# Patient Record
Sex: Male | Born: 1990 | Race: White | Hispanic: No | Marital: Single | Smoking: Former smoker
Health system: Southern US, Community
[De-identification: ages and names within clinical notes are randomized; demographics above are authoritative.]

## PROBLEM LIST (undated history)

## (undated) DIAGNOSIS — F1910 Other psychoactive substance abuse, uncomplicated: Secondary | ICD-10-CM

## (undated) DIAGNOSIS — G4700 Insomnia, unspecified: Secondary | ICD-10-CM

## (undated) DIAGNOSIS — L818 Other specified disorders of pigmentation: Secondary | ICD-10-CM

## (undated) DIAGNOSIS — R635 Abnormal weight gain: Secondary | ICD-10-CM

## (undated) DIAGNOSIS — F329 Major depressive disorder, single episode, unspecified: Secondary | ICD-10-CM

## (undated) DIAGNOSIS — E291 Testicular hypofunction: Secondary | ICD-10-CM

## (undated) DIAGNOSIS — L739 Follicular disorder, unspecified: Secondary | ICD-10-CM

## (undated) HISTORY — DX: Insomnia, unspecified: G47.00

## (undated) HISTORY — DX: Other specified disorders of pigmentation: L81.8

## (undated) HISTORY — DX: Abnormal weight gain: R63.5

## (undated) HISTORY — PX: HERNIA REPAIR: SHX51

## (undated) HISTORY — DX: Major depressive disorder, single episode, unspecified: F32.9

## (undated) HISTORY — DX: Other psychoactive substance abuse, uncomplicated: F19.10

## (undated) HISTORY — DX: Follicular disorder, unspecified: L73.9

## (undated) HISTORY — DX: Testicular hypofunction: E29.1

---

## 2015-04-13 ENCOUNTER — Emergency Department (INDEPENDENT_AMBULATORY_CARE_PROVIDER_SITE_OTHER)
Admission: EM | Admit: 2015-04-13 | Discharge: 2015-04-13 | Disposition: A | Discharge status: Nursing Home (Includes ICF) | Payer: Self-pay | Source: Home / Self Care | Attending: Family Medicine | Admitting: Family Medicine

## 2015-04-13 ENCOUNTER — Encounter (HOSPITAL_COMMUNITY): Payer: Self-pay | Admitting: Emergency Medicine

## 2015-04-13 ENCOUNTER — Encounter (HOSPITAL_COMMUNITY): Payer: Self-pay | Admitting: *Deleted

## 2015-04-13 ENCOUNTER — Emergency Department (HOSPITAL_COMMUNITY)
Admission: EM | Admit: 2015-04-13 | Discharge: 2015-04-13 | Disposition: A | Discharge status: Home / Self Care | Payer: Self-pay | Attending: Emergency Medicine | Admitting: Emergency Medicine

## 2015-04-13 ENCOUNTER — Emergency Department (HOSPITAL_COMMUNITY): Payer: Self-pay

## 2015-04-13 DIAGNOSIS — R519 Headache, unspecified: Secondary | ICD-10-CM

## 2015-04-13 DIAGNOSIS — R509 Fever, unspecified: Secondary | ICD-10-CM

## 2015-04-13 DIAGNOSIS — Z79899 Other long term (current) drug therapy: Secondary | ICD-10-CM | POA: Insufficient documentation

## 2015-04-13 DIAGNOSIS — K625 Hemorrhage of anus and rectum: Secondary | ICD-10-CM | POA: Insufficient documentation

## 2015-04-13 DIAGNOSIS — R51 Headache: Secondary | ICD-10-CM

## 2015-04-13 DIAGNOSIS — Z720 Tobacco use: Secondary | ICD-10-CM | POA: Insufficient documentation

## 2015-04-13 LAB — CBC WITH DIFFERENTIAL/PLATELET
Basophils Absolute: 0 10*3/uL (ref 0.0–0.1)
Basophils Relative: 0 % (ref 0–1)
Eosinophils Absolute: 0 10*3/uL (ref 0.0–0.7)
Eosinophils Relative: 0 % (ref 0–5)
HCT: 43.1 % (ref 39.0–52.0)
Hemoglobin: 14.5 g/dL (ref 13.0–17.0)
LYMPHS ABS: 0.9 10*3/uL (ref 0.7–4.0)
Lymphocytes Relative: 20 % (ref 12–46)
MCH: 30.6 pg (ref 26.0–34.0)
MCHC: 33.6 g/dL (ref 30.0–36.0)
MCV: 90.9 fL (ref 78.0–100.0)
Monocytes Absolute: 0.7 10*3/uL (ref 0.1–1.0)
Monocytes Relative: 15 % — ABNORMAL HIGH (ref 3–12)
NEUTROS ABS: 3.1 10*3/uL (ref 1.7–7.7)
Neutrophils Relative %: 65 % (ref 43–77)
PLATELETS: 249 10*3/uL (ref 150–400)
RBC: 4.74 MIL/uL (ref 4.22–5.81)
RDW: 13.2 % (ref 11.5–15.5)
WBC: 4.8 10*3/uL (ref 4.0–10.5)

## 2015-04-13 LAB — COMPREHENSIVE METABOLIC PANEL
ALT: 22 U/L (ref 17–63)
ANION GAP: 12 (ref 5–15)
AST: 31 U/L (ref 15–41)
Albumin: 3.9 g/dL (ref 3.5–5.0)
Alkaline Phosphatase: 47 U/L (ref 38–126)
BUN: 10 mg/dL (ref 6–20)
CO2: 25 mmol/L (ref 22–32)
CREATININE: 1.31 mg/dL — AB (ref 0.61–1.24)
Calcium: 8.8 mg/dL — ABNORMAL LOW (ref 8.9–10.3)
Chloride: 98 mmol/L — ABNORMAL LOW (ref 101–111)
GFR calc non Af Amer: >60 mL/min (ref >60)
Glucose, Bld: 80 mg/dL (ref 65–99)
Potassium: 4.1 mmol/L (ref 3.5–5.1)
Sodium: 135 mmol/L (ref 135–145)
Total Bilirubin: 0.6 mg/dL (ref 0.3–1.2)
Total Protein: 6.7 g/dL (ref 6.5–8.1)

## 2015-04-13 LAB — URINALYSIS, ROUTINE W REFLEX MICROSCOPIC
Glucose, UA: NEGATIVE mg/dL
HGB URINE DIPSTICK: NEGATIVE
Ketones, ur: 15 mg/dL — AB
Leukocytes, UA: NEGATIVE
Nitrite: NEGATIVE
PH: 7 (ref 5.0–8.0)
PROTEIN: 30 mg/dL — AB
Specific Gravity, Urine: 1.031 — ABNORMAL HIGH (ref 1.005–1.030)
UROBILINOGEN UA: 1 mg/dL (ref 0.0–1.0)

## 2015-04-13 LAB — URINE MICROSCOPIC-ADD ON

## 2015-04-13 LAB — I-STAT CG4 LACTIC ACID, ED: Lactic Acid, Venous: 0.86 mmol/L (ref 0.5–2.0)

## 2015-04-13 MED ORDER — IOHEXOL 300 MG/ML  SOLN
25.0000 mL | Freq: Once | INTRAMUSCULAR | Status: AC | PRN
  Administered 2015-04-13: 25 mL via ORAL

## 2015-04-13 MED ORDER — ACETAMINOPHEN 325 MG PO TABS
650.0000 mg | ORAL_TABLET | Freq: Once | ORAL | Status: AC
  Administered 2015-04-13: 650 mg via ORAL

## 2015-04-13 MED ORDER — CIPROFLOXACIN HCL 500 MG PO TABS
500.0000 mg | ORAL_TABLET | Freq: Once | ORAL | Status: AC
  Administered 2015-04-13: 500 mg via ORAL
  Filled 2015-04-13: qty 1

## 2015-04-13 MED ORDER — IBUPROFEN 800 MG PO TABS
ORAL_TABLET | ORAL | Status: AC
  Filled 2015-04-13: qty 1

## 2015-04-13 MED ORDER — ACETAMINOPHEN 500 MG PO TABS
1000.0000 mg | ORAL_TABLET | Freq: Four times a day (QID) | ORAL | Status: DC | PRN
  Administered 2015-04-13: 1000 mg via ORAL
  Filled 2015-04-13: qty 2

## 2015-04-13 MED ORDER — IOHEXOL 300 MG/ML  SOLN
100.0000 mL | Freq: Once | INTRAMUSCULAR | Status: AC | PRN
  Administered 2015-04-13: 100 mL via INTRAVENOUS

## 2015-04-13 MED ORDER — IBUPROFEN 800 MG PO TABS: 800.0000 mg | ORAL_TABLET | Freq: Once | ORAL | Status: DC

## 2015-04-13 MED ORDER — ACETAMINOPHEN 325 MG PO TABS
ORAL_TABLET | ORAL | Status: AC
  Filled 2015-04-13: qty 2

## 2015-04-13 MED ORDER — CIPROFLOXACIN HCL 500 MG PO TABS: 500.0000 mg | ORAL_TABLET | Freq: Two times a day (BID) | ORAL | Status: DC

## 2015-04-13 NOTE — Discharge Instructions (Signed)
If you develop worsening symptoms, especially neck pain or stiffness, worsening headache, pain, worsening rectal bleeding, please return to the ER immediately for repeat evaluation.  Bloody Stools Bloody stools often mean that there is a problem in the digestive tract. Your caregiver may use the term "melena" to describe black, tarry, and bad smelling stools or "hematochezia" to describe red or maroon-colored stools. Blood seen in the stool can be caused by bleeding anywhere along the intestinal tract.  A black stool usually means that blood is coming from the upper part of the gastrointestinal tract (esophagus, stomach, or small bowel). Passing maroon-colored stools or bright red blood usually means that blood is coming from lower down in the large bowel or the rectum. However, sometimes massive bleeding in the stomach or small intestine can cause bright red bloody stools.  Consuming black licorice, lead, iron pills, medicines containing bismuth subsalicylate, or blueberries can also cause black stools. Your caregiver can test black stools to see if blood is present. It is important that the cause of the bleeding be found. Treatment can then be started, and the problem can be corrected. Rectal bleeding may not be serious, but you should not assume everything is okay until you know the cause.It is very important to follow up with your caregiver or a specialist in gastrointestinal problems. CAUSES  Blood in the stools can come from various underlying causes.Often, the cause is not found during your first visit. Testing is often needed to discover the cause of bleeding in the gastrointestinal tract. Causes range from simple to serious or even life-threatening.Possible causes include:  Hemorrhoids.These are veins that are full of blood (engorged) in the rectum. They cause pain, inflammation, and may bleed.  Anal fissures.These are areas of painful tearing which may bleed. They are often caused by  passing hard stool.  Diverticulosis.These are pouches that form on the colon over time, with age, and may bleed significantly.  Diverticulitis.This is inflammation in areas with diverticulosis. It can cause pain, fever, and bloody stools, although bleeding is rare.  Proctitis and colitis. These are inflamed areas of the rectum or colon. They may cause pain, fever, and bloody stools.  Polyps and cancer. Colon cancer is a leading cause of preventable cancer death.It often starts out as precancerous polyps that can be removed during a colonoscopy, preventing progression into cancer. Sometimes, polyps and cancer may cause rectal bleeding.  Gastritis and ulcers.Bleeding from the upper gastrointestinal tract (near the stomach) may travel through the intestines and produce black, sometimes tarry, often bad smelling stools. In certain cases, if the bleeding is fast enough, the stools may not be black, but red and the condition may be life-threatening. SYMPTOMS  You may have stools that are bright red and bloody, that are normal color with blood on them, or that are dark black and tarry. In some cases, you may only have blood in the toilet bowl. Any of these cases need medical care. You may also have:  Pain at the anus or anywhere in the rectum.  Lightheadedness or feeling faint.  Extreme weakness.  Nausea or vomiting.  Fever. DIAGNOSIS Your caregiver may use the following methods to find the cause of your bleeding:  Taking a medical history. Age is important. Older people tend to develop polyps and cancer more often. If there is anal pain and a hard, large stool associated with bleeding, a tear of the anus may be the cause. If blood drips into the toilet after a bowel movement, bleeding hemorrhoids  may be the problem. The color and frequency of the bleeding are additional considerations. In most cases, the medical history provides clues, but seldom the final answer.  A visual and finger  (digital) exam. Your caregiver will inspect the anal area, looking for tears and hemorrhoids. A finger exam can provide information when there is tenderness or a growth inside. In men, the prostate is also examined.  Endoscopy. Several types of small, long scopes (endoscopes) are used to view the colon.  In the office, your caregiver may use a rigid, or more commonly, a flexible viewing sigmoidoscope. This exam is called flexible sigmoidoscopy. It is performed in 5 to 10 minutes.  A more thorough exam is accomplished with a colonoscope. It allows your caregiver to view the entire 5 to 6 foot long colon. Medicine to help you relax (sedative) is usually given for this exam. Frequently, a bleeding lesion may be present beyond the reach of the sigmoidoscope. So, a colonoscopy may be the best exam to start with. Both exams are usually done on an outpatient basis. This means the patient does not stay overnight in the hospital or surgery center.  An upper endoscopy may be needed to examine your stomach. Sedation is used and a flexible endoscope is put in your mouth, down to your stomach.  A barium enema X-ray. This is an X-ray exam. It uses liquid barium inserted by enema into the rectum. This test alone may not identify an actual bleeding point. X-rays highlight abnormal shadows, such as those made by lumps (tumors), diverticuli, or colitis. TREATMENT  Treatment depends on the cause of your bleeding.   For bleeding from the stomach or colon, the caregiver doing your endoscopy or colonoscopy may be able to stop the bleeding as part of the procedure.  Inflammation or infection of the colon can be treated with medicines.  Many rectal problems can be treated with creams, suppositories, or warm baths.  Surgery is sometimes needed.  Blood transfusions are sometimes needed if you have lost a lot of blood.  For any bleeding problem, let your caregiver know if you take aspirin or other blood thinners  regularly. HOME CARE INSTRUCTIONS   Take any medicines exactly as prescribed.  Keep your stools soft by eating a diet high in fiber. Prunes (1 to 3 a day) work well for many people.  Drink enough water and fluids to keep your urine clear or pale yellow.  Take sitz baths if advised. A sitz bath is when you sit in a bathtub with warm water for 10 to 15 minutes to soak, soothe, and cleanse the rectal area.  If enemas or suppositories are advised, be sure you know how to use them. Tell your caregiver if you have problems with this.  Monitor your bowel movements to look for signs of improvement or worsening. SEEK MEDICAL CARE IF:   You do not improve in the time expected.  Your condition worsens after initial improvement.  You develop any new symptoms. SEEK IMMEDIATE MEDICAL CARE IF:   You develop severe or prolonged rectal bleeding.  You vomit blood.  You feel weak or faint.  You have a fever. MAKE SURE YOU:  Understand these instructions.  Will watch your condition.  Will get help right away if you are not doing well or get worse. Document Released: 10/11/2002 Document Revised: 01/13/2012 Document Reviewed: 03/08/2011 [facility] Patient Information 2015 [place], [place]. This information is not intended to replace advice given to you by your health care provider.  Make sure you discuss any questions you have with your health care provider.  Fever, Adult A fever is a higher than normal body temperature. In an adult, an oral temperature around 98.6 F (37 C) is considered normal. A temperature of 100.4 F (38 C) or higher is generally considered a fever. Mild or moderate fevers generally have no long-term effects and often do not require treatment. Extreme fever (greater than or equal to 106 F or 41.1 C) can cause seizures. The sweating that may occur with repeated or prolonged fever may cause dehydration. Elderly people can develop confusion during a fever. A measured  temperature can vary with:  Age.  Time of day.  Method of measurement (mouth, underarm, rectal, or ear). The fever is confirmed by taking a temperature with a thermometer. Temperatures can be taken different ways. Some methods are accurate and some are not.  An oral temperature is used most commonly. Electronic thermometers are fast and accurate.  An ear temperature will only be accurate if the thermometer is positioned as recommended by the manufacturer.  A rectal temperature is accurate and done for those adults who have a condition where an oral temperature cannot be taken.  An underarm (axillary) temperature is not accurate and not recommended. Fever is a symptom, not a disease.  CAUSES   Infections commonly cause fever.  Some noninfectious causes for fever include:  Some arthritis conditions.  Some thyroid or adrenal gland conditions.  Some immune system conditions.  Some types of cancer.  A medicine reaction.  High doses of certain street drugs such as methamphetamine.  Dehydration.  Exposure to high outside or room temperatures.  Occasionally, the source of a fever cannot be determined. This is sometimes called a "fever of unknown origin" (FUO).  Some situations may lead to a temporary rise in body temperature that may go away on its own. Examples are:  Childbirth.  Surgery.  Intense exercise. HOME CARE INSTRUCTIONS   Take appropriate medicines for fever. Follow dosing instructions carefully. If you use acetaminophen to reduce the fever, be careful to avoid taking other medicines that also contain acetaminophen. Do not take aspirin for a fever if you are younger than age 7. There is an association with Reye's syndrome. Reye's syndrome is a rare but potentially deadly disease.  If an infection is present and antibiotics have been prescribed, take them as directed. Finish them even if you start to feel better.  Rest as needed.  Maintain an adequate fluid  intake. To prevent dehydration during an illness with prolonged or recurrent fever, you may need to drink extra fluid.Drink enough fluids to keep your urine clear or pale yellow.  Sponging or bathing with room temperature water may help reduce body temperature. Do not use ice water or alcohol sponge baths.  Dress comfortably, but do not over-bundle. SEEK MEDICAL CARE IF:   You are unable to keep fluids down.  You develop vomiting or diarrhea.  You are not feeling at least partly better after 3 days.  You develop new symptoms or problems. SEEK IMMEDIATE MEDICAL CARE IF:   You have shortness of breath or trouble breathing.  You develop excessive weakness.  You are dizzy or you faint.  You are extremely thirsty or you are making little or no urine.  You develop new pain that was not there before (such as in the head, neck, chest, back, or abdomen).  You have persistent vomiting and diarrhea for more than 1 to 2 days.  You  develop a stiff neck or your eyes become sensitive to light.  You develop a skin rash.  You have a fever or persistent symptoms for more than 2 to 3 days.  You have a fever and your symptoms suddenly get worse. MAKE SURE YOU:   Understand these instructions.  Will watch your condition.  Will get help right away if you are not doing well or get worse. Document Released: 04/16/2001 Document Revised: 03/07/2014 Document Reviewed: 08/22/2011 [facility] Patient Information 2015 [place], [place]. This information is not intended to replace advice given to you by your health care provider. Make sure you discuss any questions you have with your health care provider.

## 2015-04-13 NOTE — ED Notes (Signed)
Pt  Reports     fever   Night  Sweats   Dizzy  Headache          Symptoms  Began this   Am         Bright  Red  Rectal  Bleeding  Is    Reported  As   Well            denys  Any  Vomiting   -  denys  Any  Headache

## 2015-04-13 NOTE — ED Provider Notes (Signed)
CSN: [id number]     Arrival date & time 04/13/15  1755 History   First MD Initiated Contact with Patient 04/13/15 1831     Chief Complaint  Patient presents with  . Rectal Bleeding  . Fever     (Consider location/radiation/quality/duration/timing/severity/associated sxs/prior Treatment) HPI Comments: Patient referred to the emergency department for evaluation of fever bleeding. Patient reports that he woke up at 3 AM and realized that he had been sweating and was soaked. At that time he also had the urge to defecate, went to the bathroom, passed some stool but there was bright red blood with it. 2 more times over the course of the day he has had bright red blood per rectum without stool. He denies abdominal pain and rectal pain, however.  He reports that yesterday he had a slight headache, but the headache resolved after he took over-the-counter pain medication. When he woke this morning, however, he had a headache once again. He reports that the headache was much worse today. It radiates to the neck, but no neck pain or stiffness.  She does report that he has noticed slight burning with urination for the last 2 days, but it happened after swimming he reports that he has had this symptom after swimming in chlorine in the past, did not think much of it.  Patient is a 24 y.o. male presenting with hematochezia and fever.  Rectal Bleeding Associated symptoms: fever   Fever Associated symptoms: headaches     History reviewed. No pertinent past medical history. Past Surgical History  Procedure Laterality Date  . Hernia repair Left     inguinal   No family history on file. History  Substance Use Topics  . Smoking status: Current Every Day Smoker -- 0.15 packs/day    Types: Cigarettes  . Smokeless tobacco: Not on file  . Alcohol Use: Yes     Comment: occ    Review of Systems  Constitutional: Positive for fever.  Gastrointestinal: Positive for hematochezia and anal bleeding.   Neurological: Positive for headaches.  All other systems reviewed and are negative.     Allergies  Nickel  Home Medications   Prior to Admission medications   Medication Sig Start Date End Date Taking? Authorizing Provider  acetaminophen (TYLENOL) 325 MG tablet Take 650 mg by mouth every 6 (six) hours as needed for moderate pain or fever.   Yes Historical Provider, MD  OVER THE COUNTER MEDICATION Take 2 tablets by mouth daily. "Anavar"   Yes Historical Provider, MD  testosterone (ANDROGEL) 50 MG/5GM (1%) GEL Place 5 g onto the skin daily.   Yes Historical Provider, MD  ciprofloxacin (CIPRO) 500 MG tablet Take 1 tablet (500 mg total) by mouth 2 (two) times daily. One po bid x 7 days 04/13/15   Gilda Crease, MD   BP 111/61 mmHg  Pulse 96  Temp(Src) 102.8 F (39.3 C) (Oral)  Resp 16  Ht 6\' 2"  (1.88 m)  Wt 206 lb (93.441 kg)  BMI 26.44 kg/m2  SpO2 95% Physical Exam  Constitutional: He is oriented to person, place, and time. He appears well-developed and well-nourished. No distress.  HENT:  Head: Normocephalic and atraumatic.  Right Ear: Hearing normal.  Left Ear: Hearing normal.  Nose: Nose normal.  Mouth/Throat: Oropharynx is clear and moist and mucous membranes are normal.  Eyes: Conjunctivae and EOM are normal. Pupils are equal, round, and reactive to light.  Neck: Normal range of motion. Neck supple.  Cardiovascular: Regular rhythm, S1  normal and S2 normal.  Exam reveals no gallop and no friction rub.   No murmur heard. Pulmonary/Chest: Effort normal and breath sounds normal. No respiratory distress. He exhibits no tenderness.  Abdominal: Soft. Normal appearance and bowel sounds are normal. There is no hepatosplenomegaly. There is no tenderness. There is no rebound, no guarding, no tenderness at McBurney's point and negative Murphy's sign. No hernia.  Musculoskeletal: Normal range of motion.  Neurological: He is alert and oriented to person, place, and time. He has  normal strength. No cranial nerve deficit or sensory deficit. Coordination normal. GCS eye subscore is 4. GCS verbal subscore is 5. GCS motor subscore is 6.  Skin: Skin is warm, dry and intact. No rash noted. No cyanosis.  Psychiatric: He has a normal mood and affect. His speech is normal and behavior is normal. Thought content normal.  Nursing note and vitals reviewed.   ED Course  Procedures (including critical care time) Labs Review Labs Reviewed  CBC WITH DIFFERENTIAL/PLATELET - Abnormal; Notable for the following:    Monocytes Relative 15 (*)    All other components within normal limits  COMPREHENSIVE METABOLIC PANEL - Abnormal; Notable for the following:    Chloride 98 (*)    Creatinine, Ser 1.31 (*)    Calcium 8.8 (*)    All other components within normal limits  URINALYSIS, ROUTINE W REFLEX MICROSCOPIC (NOT AT [facility]) - Abnormal; Notable for the following:    Specific Gravity, Urine 1.031 (*)    Bilirubin Urine SMALL (*)    Ketones, ur 15 (*)    Protein, ur 30 (*)    All other components within normal limits  CULTURE, BLOOD (ROUTINE X 2)  CULTURE, BLOOD (ROUTINE X 2)  URINE CULTURE  URINE MICROSCOPIC-ADD ON  I-STAT CG4 LACTIC ACID, ED    Imaging Review Ct Head Wo Contrast  04/13/2015   CLINICAL DATA:  Acute onset headache and diaphoresis for 1 day.  EXAM: CT HEAD WITHOUT CONTRAST  TECHNIQUE: Contiguous axial images were obtained from the base of the skull through the vertex without intravenous contrast.  COMPARISON:  None.  FINDINGS: No evidence of intracranial hemorrhage, brain edema, or other signs of acute infarction. No evidence of intracranial mass lesion or mass effect.  No abnormal extraaxial fluid collections identified. Ventricles are normal in size. No skull abnormality identified.  Mucosal thickening is seen involving the left maxillary and bilateral ethmoid sinuses.  IMPRESSION: No evidence of intracranial abnormality.  Mild left maxillary and bilateral ethmoid sinus  mucosal thickening.   Electronically Signed   By: Myles Rosenthal M.D.   On: 04/13/2015 21:06   Ct Abdomen Pelvis W Contrast  04/13/2015   CLINICAL DATA:  Acute onset lower GI bleeding today. Bright red blood per Rectum. Diaphoresis.  EXAM: CT ABDOMEN AND PELVIS WITH CONTRAST  TECHNIQUE: Multidetector CT imaging of the abdomen and pelvis was performed using the standard protocol following bolus administration of intravenous contrast.  CONTRAST:  OMNIPAQUE IOHEXOL 300 MG/ML  SOLN  COMPARISON:  None.  FINDINGS: Lower Chest:  Unremarkable.  Hepatobiliary: No masses or other significant abnormality identified. Probable tiny sub-cm cyst seen in posterior segment right hepatic lobe. Gallbladder is unremarkable.  Pancreas: No mass, inflammatory changes, or other significant abnormality identified.  Spleen:  Within normal limits in size and appearance.  Adrenals:  No masses identified.  Kidneys/Urinary Tract:  No evidence of masses or hydronephrosis.  Stomach/Bowel/Peritoneum: No evidence of wall thickening, mass, or obstruction.  Vascular/Lymphatic: No pathologically enlarged  lymph nodes identified. No other significant abnormality visualized.  Reproductive:  No mass or other significant abnormality identified.  Other:  None.  Musculoskeletal:  No suspicious bone lesions identified.  IMPRESSION: Negative. No acute findings or other significant abnormality identified.   Electronically Signed   By: Myles Rosenthal M.D.   On: 04/13/2015 21:12     EKG Interpretation None      MDM   Final diagnoses:  Headache  Fever, unspecified fever cause  Rectal bleeding   Patient presents to the emergency department for evaluation of fever and rectal bleeding. Patient reports waking up in the middle of the night with fever, chills and sweats. This was followed by rectal bleeding. He has a benign abdominal exam. The remainder of his examination was unremarkable. He does report headache, but this resolved with Tylenol as his  fever improved. He did not have any neck stiffness or meningismus on examination.  Underwent CT scan of abdomen and pelvis that did not show any acute pathology. Chest x-ray was clear, no source of fever. Urinalysis was normal. CBC was normal including white blood cell count of 4.8. Lab work is reassuring.  I did have a lengthy conversation with the patient about lumbar puncture. I did recommend performing a lumbar puncture because I did not have a source for the fever and he does have a headache. A explained to him at length the process of meningitis and encephalitis. He does not wish to undergo the procedure at this time. I did explain to him that if he had a bacterial meningitis, this could lead to permanent disability or rapidly progressed to death in the next few hours. He understands this, wishes to be discharged and will return if his symptoms worsen. He understands worsening neck pain and stiffness is a sign of meningitis.  Patient with fever and rectal bleeding. He does not, however, have any abdominal pain or tenderness. I still have to consider the possibility of infectious enteritis as a cause. He has not, however, had any recent travel. He will be started on Cipro empirically to cover for enteropathogens. He will be referred to gastroenterology for further evaluation as an outpatient. [facility] gastroenterology is on call.    Gilda Crease, MD 04/13/15 2227

## 2015-04-13 NOTE — ED Notes (Addendum)
Pt sent here from UC for bright red rectal bleeding this am, fever of 103 (given tylenol at [facility]) and headache. Pt photophobic with some neck pain/body aches.  Pt denies nausea or abdominal pain.  Pt feels he should mention he is taking steroids for muscle building.

## 2015-04-13 NOTE — ED Provider Notes (Signed)
CSN: [id number]     Arrival date & time 04/13/15  1502 History   First MD Initiated Contact with Patient 04/13/15 1622     Chief Complaint  Patient presents with  . Fever  . Rectal Bleeding   (Consider location/radiation/quality/duration/timing/severity/associated sxs/prior Treatment) HPI Comments: 24 year old male states he awoke this morning at 3 AM with a profuse diaphoresis soaking his sheets. Shortly afterward he had to have a bowel movement, went to the bathroom had a couple stools with what he calls [place] was a substantial amount of bright red bleeding. A short time later he felt the urge to defecate and it was only blood. This happened one more time. He felt as though he might had a fever but did not feel as though he were particular sick. He denies rectal pain. Denies abdominal pain, vomiting, nausea or diarrhea. He does have a headache and feels dizzy. In the past couple of hours he has felt worse than before. Denies earache, sore throat, upper respiratory congestion, cough, shortness of breath or chest pain. He does admit to having mild intermittent dysuria for 2 days.   History reviewed. No pertinent past medical history. History reviewed. No pertinent past surgical history. History reviewed. No pertinent family history. History  Substance Use Topics  . Smoking status: Current Every Day Smoker  . Smokeless tobacco: Not on file  . Alcohol Use: Yes    Review of Systems  Constitutional: Positive for activity change and appetite change.  Eyes: Negative for visual disturbance.  Respiratory: Negative.   Cardiovascular: Negative.   Gastrointestinal: Positive for blood in stool. Negative for nausea, vomiting, abdominal pain, diarrhea, constipation and rectal pain.  Genitourinary: Positive for dysuria.  Musculoskeletal: Negative.   Skin: Negative.   Neurological: Positive for dizziness and headaches. Negative for tremors, seizures, syncope, speech difficulty, weakness and numbness.   Hematological: Negative for adenopathy.  Psychiatric/Behavioral: Negative for behavioral problems and agitation. The patient is not nervous/anxious.     Allergies  Review of patient's allergies indicates no known allergies.  Home Medications   Prior to Admission medications   Medication Sig Start Date End Date Taking? Authorizing Provider  testosterone (ANDROGEL) 50 MG/5GM (1%) GEL Place 5 g onto the skin daily.   Yes Historical Provider, MD   BP 162/85 mmHg  Pulse 120  Temp(Src) 103 F (39.4 C) (Oral)  Resp 18  SpO2 97% Physical Exam  Constitutional: He is oriented to person, place, and time. He appears well-developed and well-nourished. No distress.  Active chills.  HENT:  Head: Normocephalic and atraumatic.  Mouth/Throat: Oropharynx is clear and moist. No oropharyngeal exudate.  Bilateral TMs are normal.  Eyes: Conjunctivae and EOM are normal. Pupils are equal, round, and reactive to light. Right eye exhibits no discharge. Left eye exhibits no discharge. No scleral icterus.  Neck: Normal range of motion. Neck supple.  Cardiovascular: Normal rate, regular rhythm, normal heart sounds and intact distal pulses.   No murmur heard. Pulmonary/Chest: Effort normal and breath sounds normal. No respiratory distress. He has no wheezes. He has no rales. He exhibits no tenderness.  Abdominal: Soft. Bowel sounds are normal. He exhibits no distension and no mass. There is no tenderness. There is no rebound and no guarding.  Genitourinary: Guaiac positive stool.  Rectal exam. Inspection reveals no external lesions or discolorations. DRE reveals no stool in the rectal vault. No palpable masses. Prostate smooth firm not enlarged. No pain associated with the exam. Guaiac briskly positive.  Musculoskeletal: Normal range of motion.  He exhibits no edema or tenderness.  Lymphadenopathy:    He has no cervical adenopathy.  Neurological: He is alert and oriented to person, place, and time. No  cranial nerve deficit. He exhibits normal muscle tone.  Skin: Skin is warm and dry. No rash noted.  Psychiatric: He has a normal mood and affect.  Nursing note and vitals reviewed.   ED Course  Procedures (including critical care time) Labs Review Labs Reviewed - No data to display  Imaging Review No results found.   MDM   1. Bright red rectal bleeding   2. Fever and chills   3. Acute nonintractable headache, unspecified headache type    Transfer patient to the emergency department for evaluation of fever of unknown origin (acute onset, 103 in the urgent care) and associated with persistent bright red bleeding.    Hayden Rasmussen, NP 04/13/15 1705

## 2015-04-13 NOTE — ED Notes (Signed)
C/o fever and blood in stool

## 2015-04-14 LAB — URINE CULTURE
COLONY COUNT: NO GROWTH
CULTURE: NO GROWTH

## 2015-04-20 LAB — CULTURE, BLOOD (ROUTINE X 2): Culture: NO GROWTH

## 2015-10-22 IMAGING — CT CT ABD-PELV W/ CM
2 of 4 series · 16 of 46 positions shown, 18 images · IV contrast (omnipaque)
Comparison: None.

CLINICAL DATA: Acute onset lower GI bleeding today. Bright red
blood per Rectum. Diaphoresis.

EXAM:
CT ABDOMEN AND PELVIS WITH CONTRAST
TECHNIQUE: Multidetector CT imaging of the abdomen and pelvis was performed
using the standard protocol following bolus administration of
intravenous contrast.
CONTRAST:  100mL OMNIPAQUE IOHEXOL 300 MG/ML  SOLN

[Series 2: abd/ pelvis 5.0 i30f 1 · axial · 0.83mm/px · z∈[-994,-514]mm · 13 of 106 slices shown, 15 images]
[im 5/106  soft-tissue]
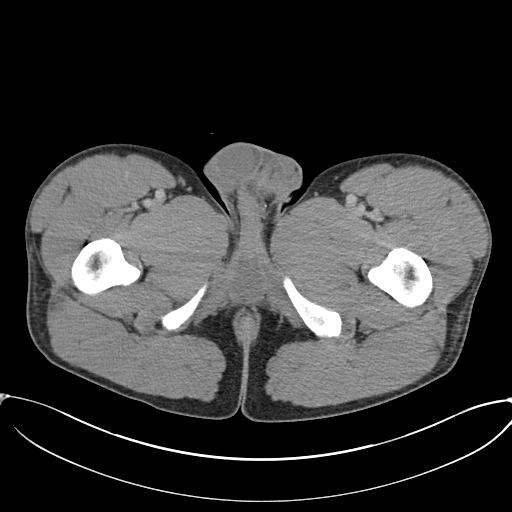
[im 5/106  bone]
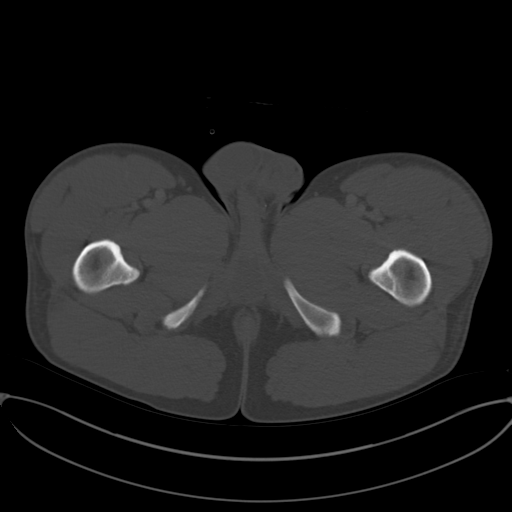
[im 13/106  soft-tissue]
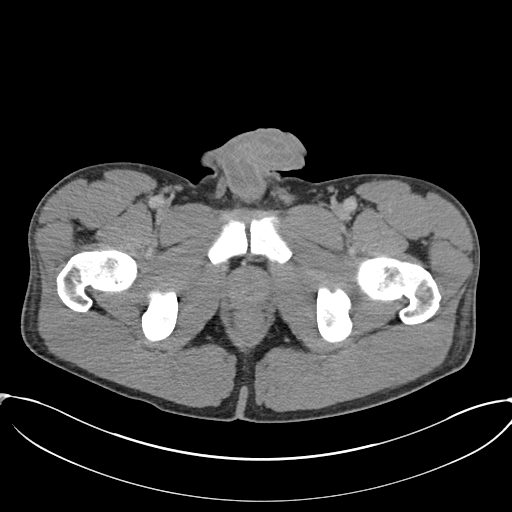
[im 22/106  soft-tissue]
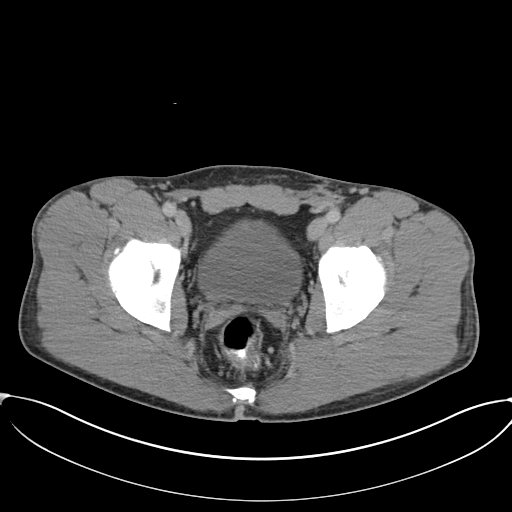
[im 30/106  soft-tissue]
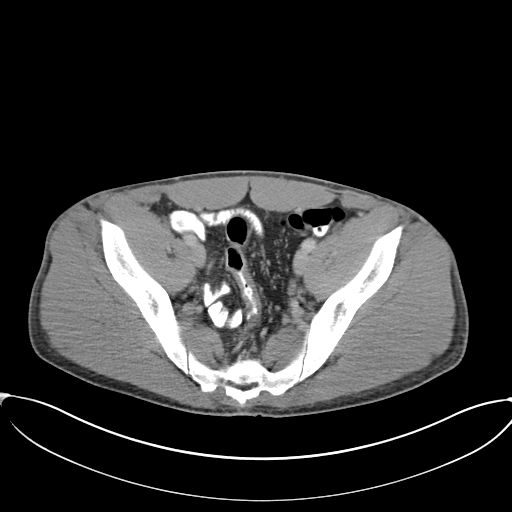
[im 38/106  soft-tissue]
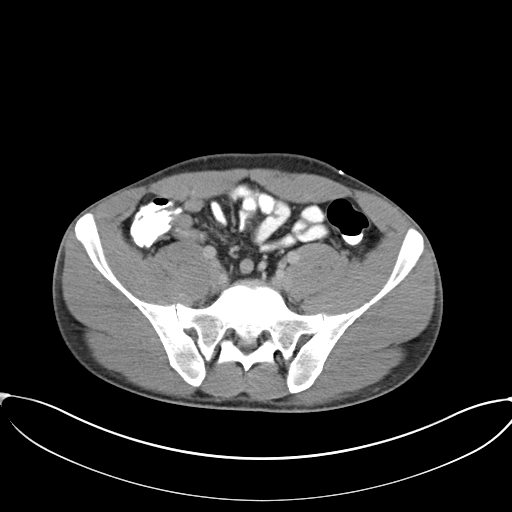
[im 47/106  soft-tissue]
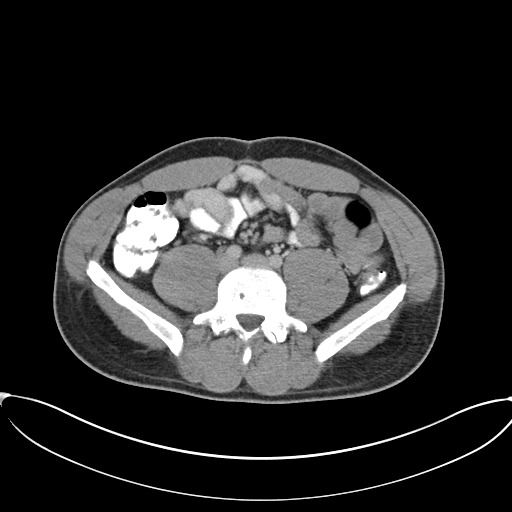
[im 55/106  soft-tissue]
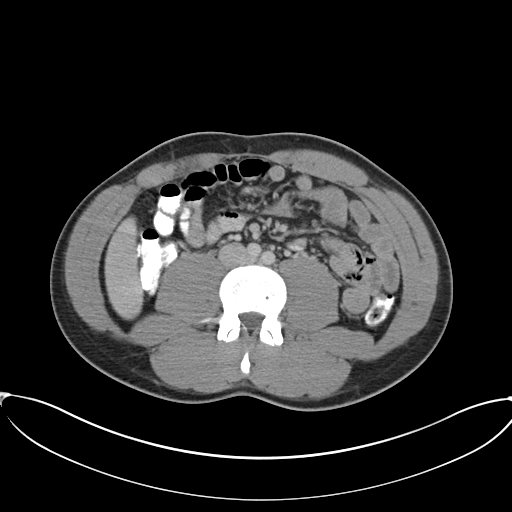
[im 59/106  soft-tissue]
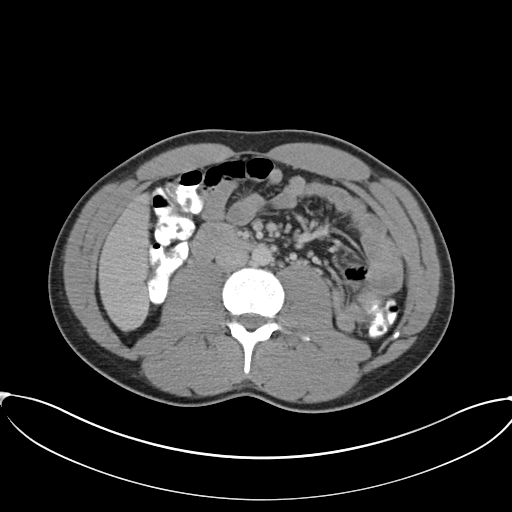
[im 68/106  soft-tissue]
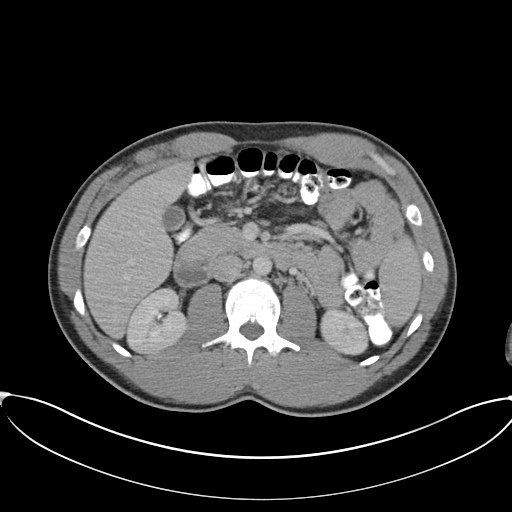
[im 68/106  bone]
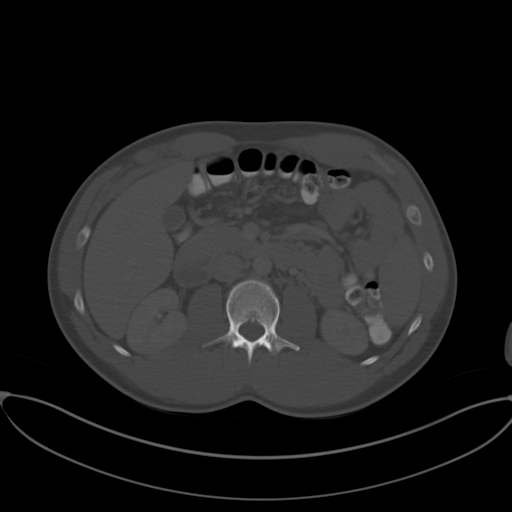
[im 76/106  soft-tissue]
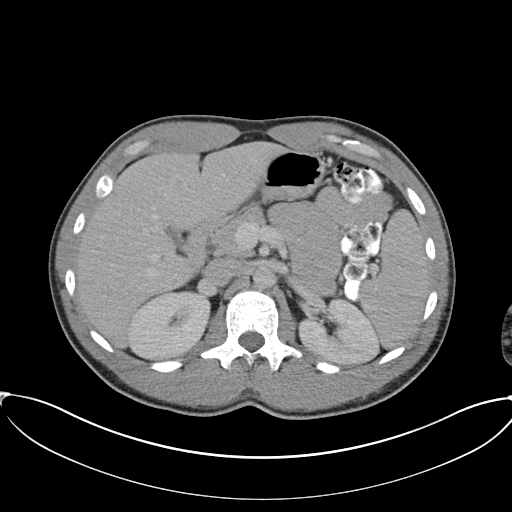
[im 85/106  soft-tissue]
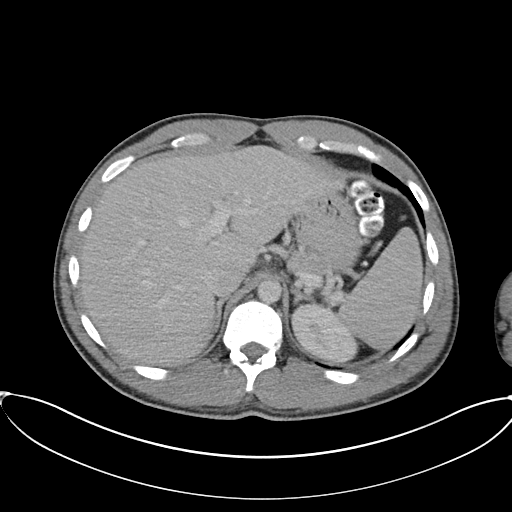
[im 93/106  soft-tissue]
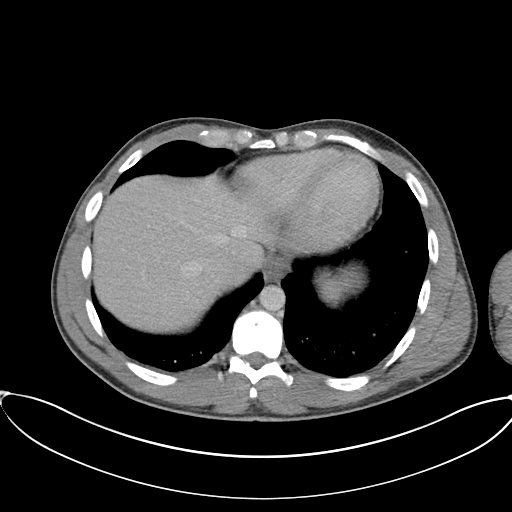
[im 101/106  soft-tissue]
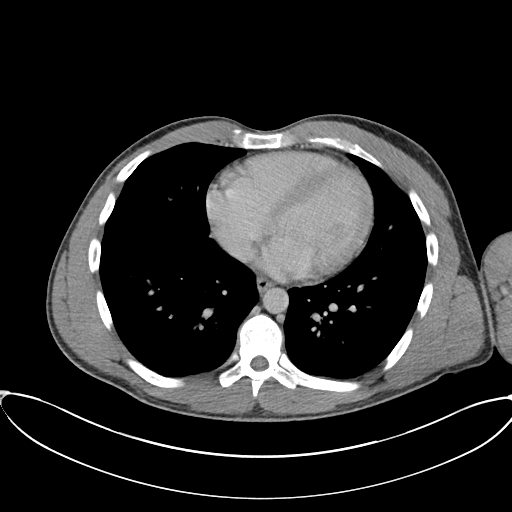

[Series 5: coronals · coronal · 0.75mm/px · 3 of 107 slices shown]
[im 36/107  soft-tissue]
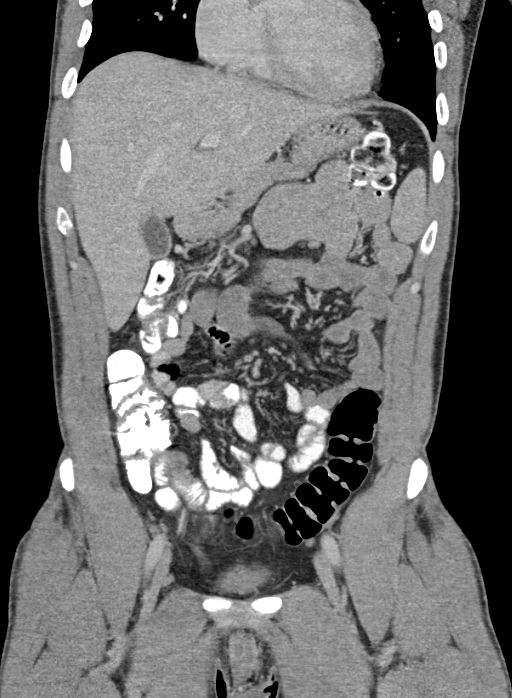
[im 48/107  soft-tissue]
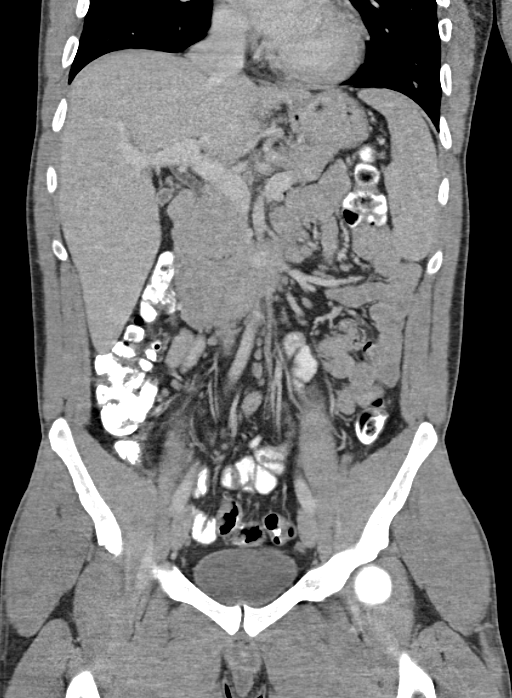
[im 59/107  soft-tissue]
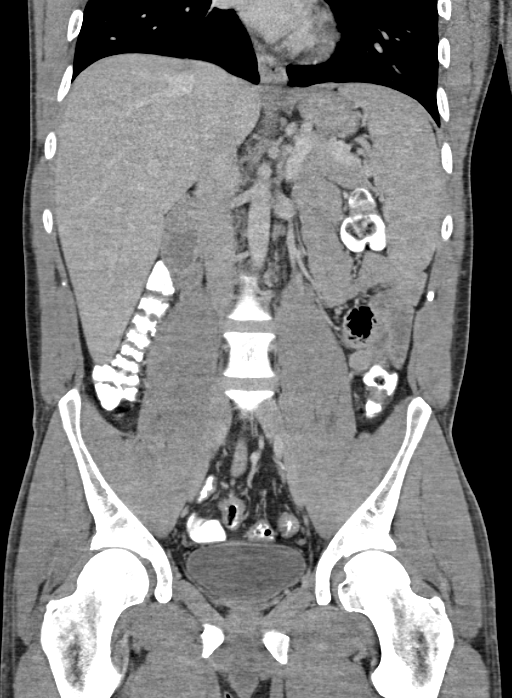

[16 of 46 positions shown; findings below may reference images not displayed]

FINDINGS: Lower Chest:  Unremarkable.

Hepatobiliary: No masses or other significant abnormality
identified. Probable tiny sub-cm cyst seen in posterior segment
right hepatic lobe. Gallbladder is unremarkable.

Pancreas: No mass, inflammatory changes, or other significant
abnormality identified.

Spleen:  Within normal limits in size and appearance.

Adrenals:  No masses identified.

Kidneys/Urinary Tract:  No evidence of masses or hydronephrosis.

Stomach/Bowel/Peritoneum: No evidence of wall thickening, mass, or
obstruction.

Vascular/Lymphatic: No pathologically enlarged lymph nodes
identified. No other significant abnormality visualized.

Reproductive:  No mass or other significant abnormality identified.

Other:  None.

Musculoskeletal:  No suspicious bone lesions identified.
IMPRESSION: Negative. No acute findings or other significant abnormality
identified.

## 2015-10-22 IMAGING — CT CT HEAD W/O CM
1 of 2 series · 16 of 30 positions shown, 20 images · non-contrast
Comparison: None.

CLINICAL DATA: Acute onset headache and diaphoresis for 1 day.

EXAM:
CT HEAD WITHOUT CONTRAST
TECHNIQUE: Contiguous axial images were obtained from the base of the skull
through the vertex without intravenous contrast.

[Series 3: head 2.0 h70h · axial · 0.47mm/px · z∈[-158,-4]mm · 16 of 87 slices shown, 20 images]
[im 5/87  brain]
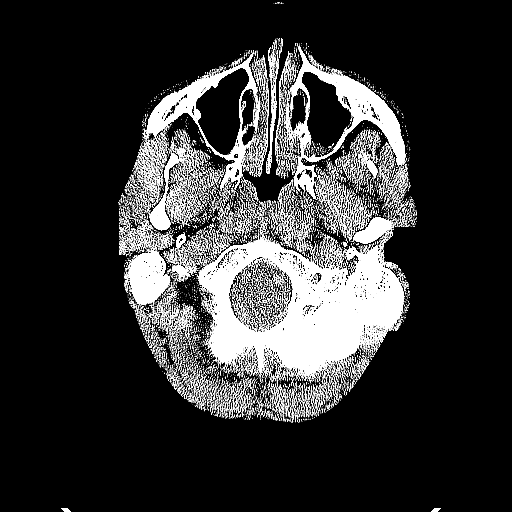
[im 5/87  bone]
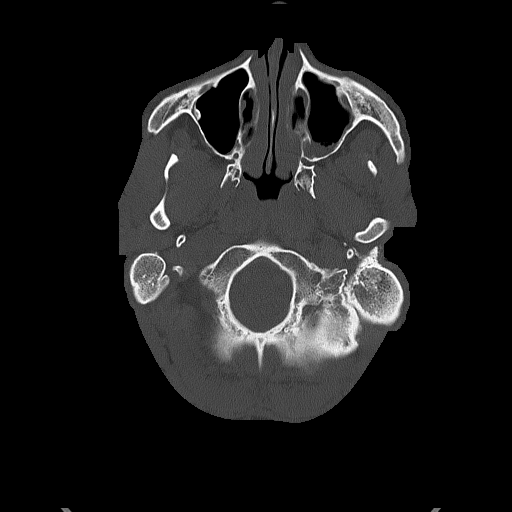
[im 9/87  brain]
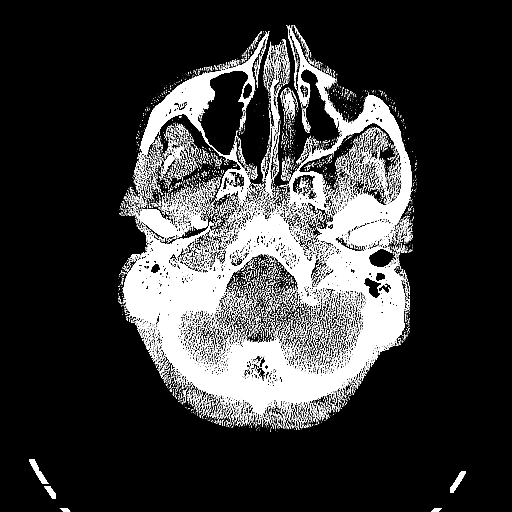
[im 13/87  brain]
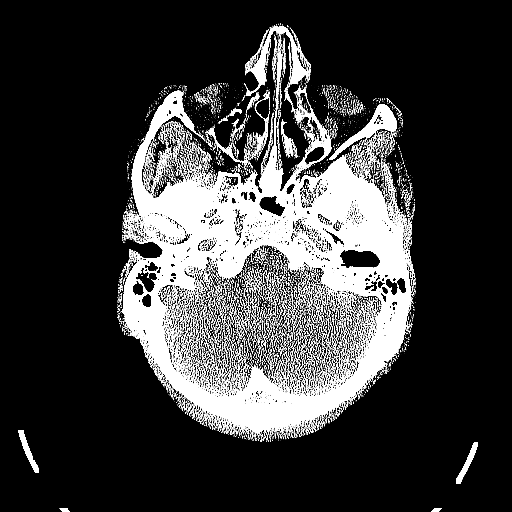
[im 22/87  brain]
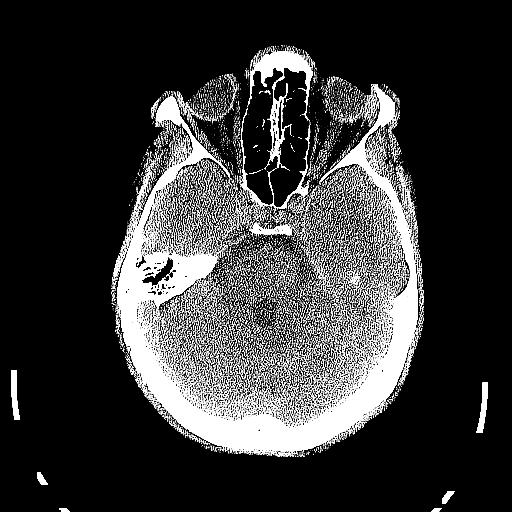
[im 26/87  brain]
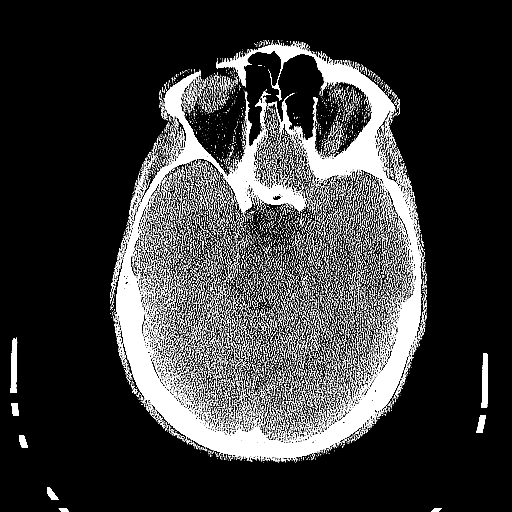
[im 26/87  bone]
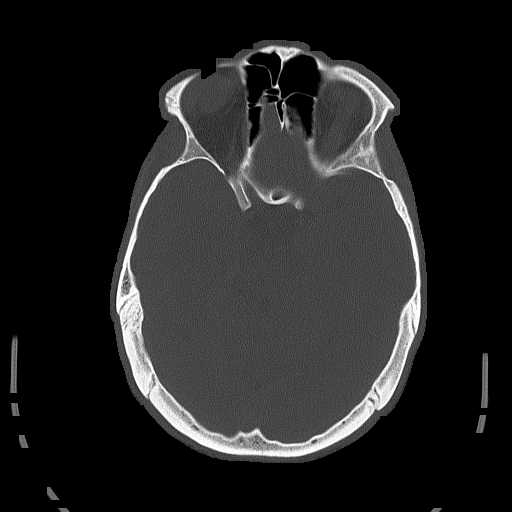
[im 31/87  brain]
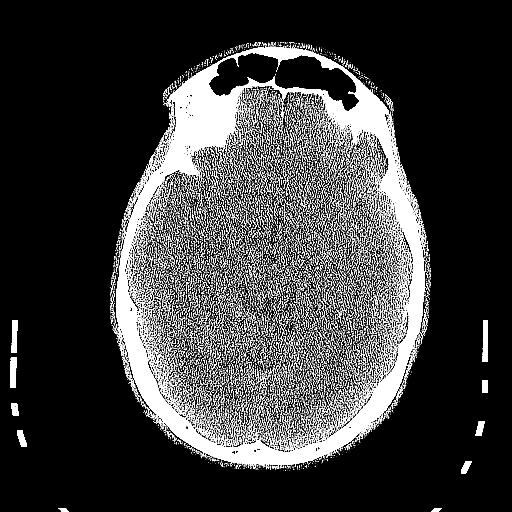
[im 35/87  brain]
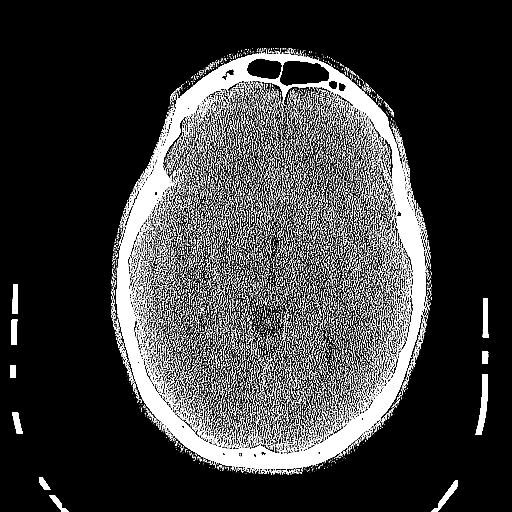
[im 39/87  brain]
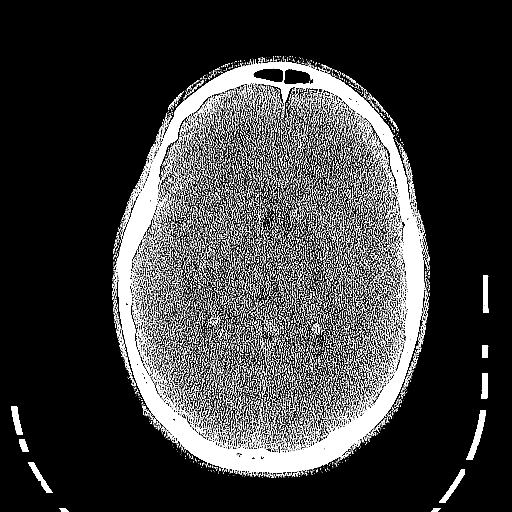
[im 48/87  brain]
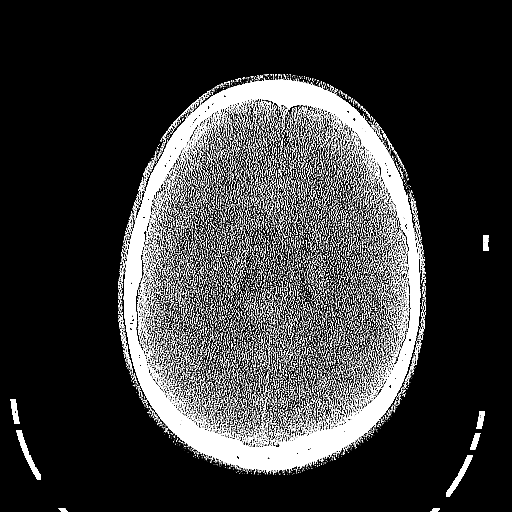
[im 48/87  bone]
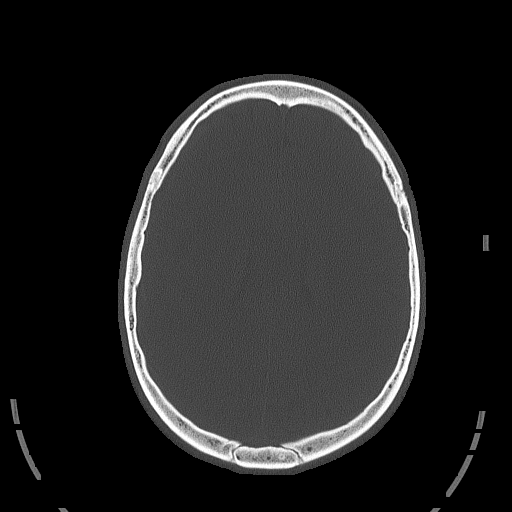
[im 52/87  brain]
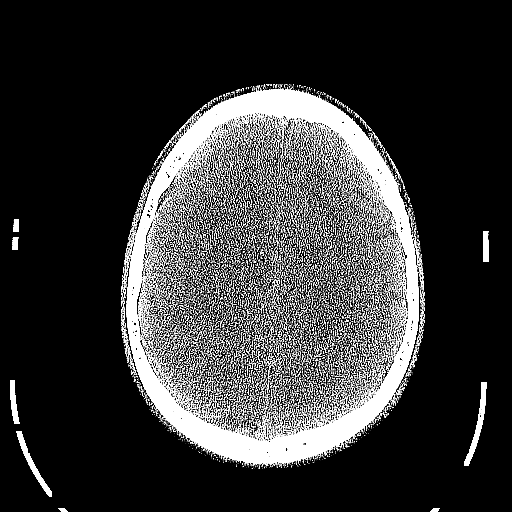
[im 56/87  brain]
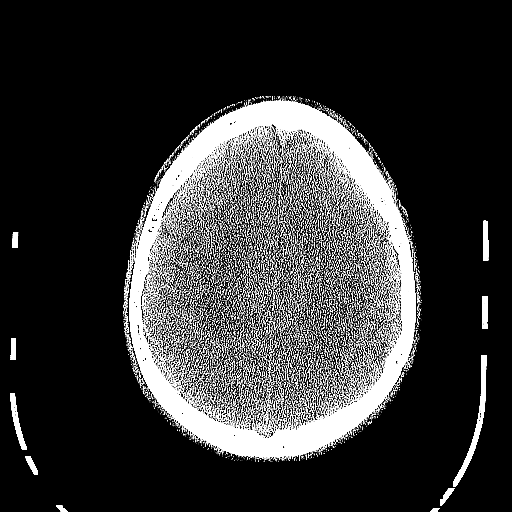
[im 61/87  brain]
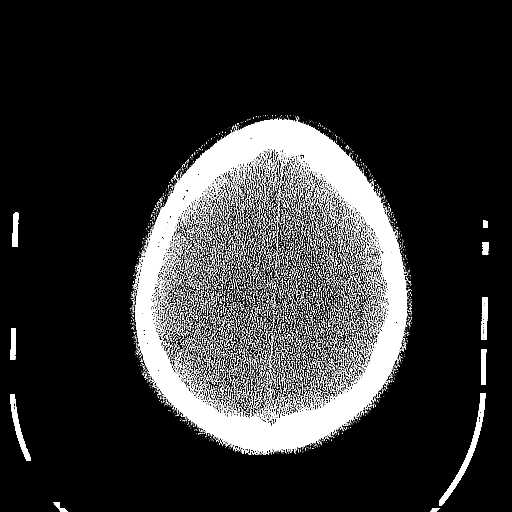
[im 65/87  brain]
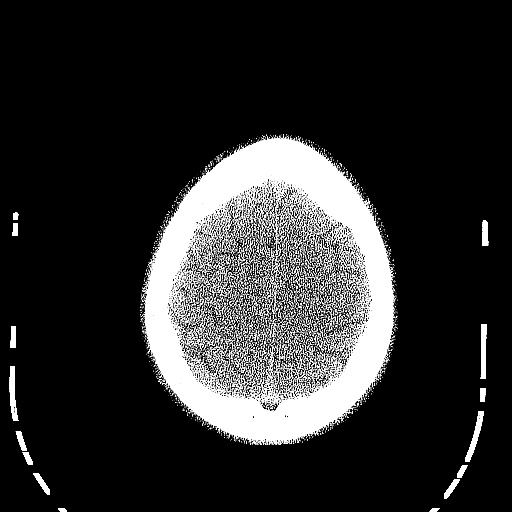
[im 65/87  bone]
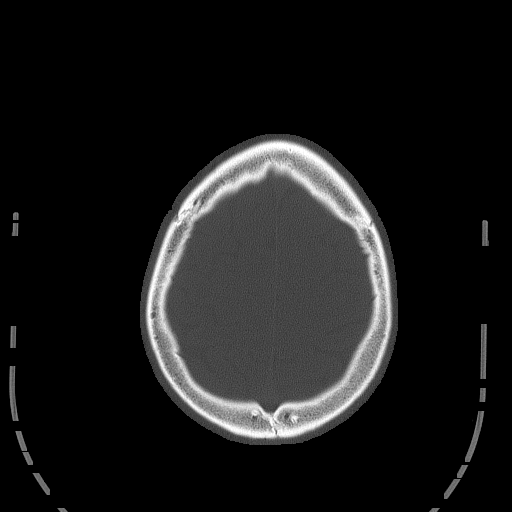
[im 74/87  brain]
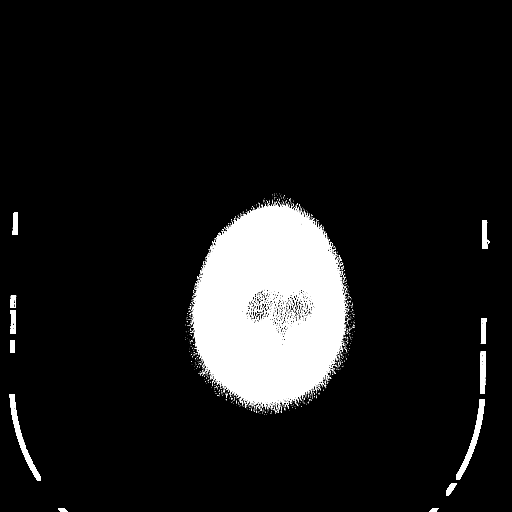
[im 78/87  brain]
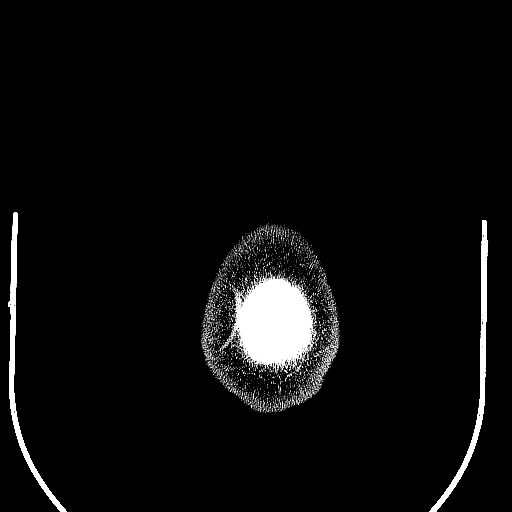
[im 82/87  brain]
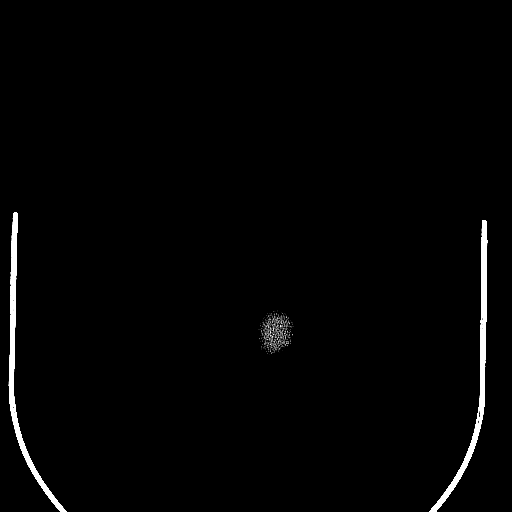

[16 of 30 positions shown; findings below may reference images not displayed]

FINDINGS: No evidence of intracranial hemorrhage, brain edema, or other signs
of acute infarction. No evidence of intracranial mass lesion or mass
effect.

No abnormal extraaxial fluid collections identified. Ventricles are
normal in size. No skull abnormality identified.

Mucosal thickening is seen involving the left maxillary and
bilateral ethmoid sinuses.
IMPRESSION: No evidence of intracranial abnormality.

Mild left maxillary and bilateral ethmoid sinus mucosal thickening.

## 2016-04-14 ENCOUNTER — Telehealth: Payer: Self-pay | Admitting: Infectious Disease

## 2016-04-14 NOTE — Telephone Encounter (Signed)
Tammy,  I was called by a friend of this young man and also talked to this patient himself.  He tested POSITIVE for HIV at the [facility] based on test done last Monday.  He has several symptoms that sound consistent with acute HIV with fever, increased lymph nodes in neck, nausea, vomiting and 10# weight loss piror to testing.  I do NOT know if his HIV test itslef is c/w acute HIV but certainly his symptoms are concerning.    If his labs were c/w acute HIV on testing done at [facility] I would have hope they would have tried to expedite [facility] but we should check to see if it might ben "acute diagnosis"--in which case he might be candidate for ACTG trial  He does not have insurance.  He works at a [employer] and makes his own schedule.  He sounds as if he should clearly qualify for [facility], [facility] and [employer]  He will be free most AMs this week and all day tomorrow Monday and Wednesday  His cell phone is   [phone]  He is suffering from siginficant depression related to his new diagnosis but no SI or HI  He was open to talking to Wes and I will give Wes his contact number as well.  I do not know if any MD has an opening this week and I know I do not have clinic but I would be willing to come in and make an appt for him to see me--he will need to do [employer] etc

## 2016-04-15 ENCOUNTER — Encounter: Payer: Self-pay | Admitting: *Deleted

## 2016-04-15 ENCOUNTER — Ambulatory Visit: Payer: Self-pay

## 2016-04-15 DIAGNOSIS — B20 Human immunodeficiency virus [HIV] disease: Secondary | ICD-10-CM

## 2016-04-15 NOTE — Progress Notes (Signed)
Jermaine Peterson was seen today for a new patient intake. He recently found out that he was HIV positive last week. [facility] He has been having symptoms of night sweats, low grade fever, fatigue, sore throat, enlarged glands and diarrhea since 5/19 and has lost about 10 lbs. He said he had an encounter around the end of April where he could have possibly contracted HIV. He needs to finish up his [name] and ADAP enrollment and then we will get his intake labs. Dr. Daiva EvesVan Dam spoke with him today and left prescriptions for him to start once all that gets approved. I offerred him counseling with our mental health counselor, peer supporter and substance abuse counselor if he feels like he needs some support. He said he had told one of his brothers about his diagnosis who is supportive. He is also aware of [facility] and what they can help him with.

## 2016-04-19 ENCOUNTER — Other Ambulatory Visit: Payer: Self-pay | Admitting: Licensed Clinical Social Worker

## 2016-04-19 DIAGNOSIS — B20 Human immunodeficiency virus [HIV] disease: Secondary | ICD-10-CM

## 2016-04-23 ENCOUNTER — Other Ambulatory Visit (INDEPENDENT_AMBULATORY_CARE_PROVIDER_SITE_OTHER): Payer: Self-pay

## 2016-04-23 DIAGNOSIS — Z113 Encounter for screening for infections with a predominantly sexual mode of transmission: Secondary | ICD-10-CM

## 2016-04-23 DIAGNOSIS — B20 Human immunodeficiency virus [HIV] disease: Secondary | ICD-10-CM

## 2016-04-23 DIAGNOSIS — Z79899 Other long term (current) drug therapy: Secondary | ICD-10-CM

## 2016-04-23 LAB — URINALYSIS
Bilirubin Urine: NEGATIVE
Glucose, UA: NEGATIVE
HGB URINE DIPSTICK: NEGATIVE
KETONES UR: NEGATIVE
LEUKOCYTES UA: NEGATIVE
NITRITE: NEGATIVE
Protein, ur: NEGATIVE
SPECIFIC GRAVITY, URINE: 1.022 (ref 1.001–1.035)
pH: 5.5 (ref 5.0–8.0)

## 2016-04-23 LAB — CBC WITH DIFFERENTIAL/PLATELET
BASOS ABS: 0 {cells}/uL (ref 0–200)
Basophils Relative: 0 %
Eosinophils Absolute: 147 cells/uL (ref 15–500)
Eosinophils Relative: 3 %
HEMATOCRIT: 44 % (ref 38.5–50.0)
HEMOGLOBIN: 15.7 g/dL (ref 13.2–17.1)
LYMPHS ABS: 1960 {cells}/uL (ref 850–3900)
Lymphocytes Relative: 40 %
MCH: 31.4 pg (ref 27.0–33.0)
MCHC: 35.7 g/dL (ref 32.0–36.0)
MCV: 88 fL (ref 80.0–100.0)
MONO ABS: 588 {cells}/uL (ref 200–950)
MPV: 8.9 fL (ref 7.5–12.5)
Monocytes Relative: 12 %
NEUTROS ABS: 2205 {cells}/uL (ref 1500–7800)
NEUTROS PCT: 45 %
Platelets: 286 10*3/uL (ref 140–400)
RBC: 5 MIL/uL (ref 4.20–5.80)
RDW: 14.2 % (ref 11.0–15.0)
WBC: 4.9 10*3/uL (ref 3.8–10.8)

## 2016-04-23 LAB — LIPID PANEL
CHOL/HDL RATIO: 3.3 ratio (ref <=5.0)
CHOLESTEROL: 139 mg/dL (ref 125–200)
HDL: 42 mg/dL (ref >=40)
LDL Cholesterol: 75 mg/dL (ref <130)
TRIGLYCERIDES: 109 mg/dL (ref <150)
VLDL: 22 mg/dL (ref <30)

## 2016-04-23 LAB — HEPATITIS A ANTIBODY, TOTAL: HEP A TOTAL AB: NONREACTIVE

## 2016-04-23 LAB — COMPLETE METABOLIC PANEL WITH GFR
ALBUMIN: 4.4 g/dL (ref 3.6–5.1)
ALK PHOS: 59 U/L (ref 40–115)
ALT: 16 U/L (ref 9–46)
AST: 15 U/L (ref 10–40)
BILIRUBIN TOTAL: 0.4 mg/dL (ref 0.2–1.2)
BUN: 11 mg/dL (ref 7–25)
CO2: 25 mmol/L (ref 20–31)
Calcium: 9.1 mg/dL (ref 8.6–10.3)
Chloride: 105 mmol/L (ref 98–110)
Creat: 0.93 mg/dL (ref 0.60–1.35)
GFR, Est African American: >89 mL/min (ref >=60)
GFR, Est Non African American: >89 mL/min (ref >=60)
GLUCOSE: 95 mg/dL (ref 65–99)
Potassium: 3.9 mmol/L (ref 3.5–5.3)
SODIUM: 140 mmol/L (ref 135–146)
TOTAL PROTEIN: 7 g/dL (ref 6.1–8.1)

## 2016-04-23 LAB — HEPATITIS C ANTIBODY: HCV Ab: NEGATIVE

## 2016-04-23 LAB — HEPATITIS B CORE ANTIBODY, TOTAL: HEP B C TOTAL AB: NONREACTIVE

## 2016-04-23 LAB — HEPATITIS B SURFACE ANTIBODY,QUALITATIVE: Hep B S Ab: POSITIVE — AB

## 2016-04-23 LAB — HEPATITIS B SURFACE ANTIGEN: Hepatitis B Surface Ag: NEGATIVE

## 2016-04-23 NOTE — Addendum Note (Signed)
Addended by: Mariea ClontsGREEN, Bryam Taborda D on: 04/23/2016 11:48 AM   Modules accepted: Orders

## 2016-04-23 NOTE — Addendum Note (Signed)
Addended by: Mariea ClontsGREEN, Assad Harbeson D on: 04/23/2016 03:12 PM   Modules accepted: Orders

## 2016-04-24 LAB — HIV-1 RNA ULTRAQUANT REFLEX TO GENTYP+
HIV 1 RNA Quant: 9947 copies/mL — ABNORMAL HIGH (ref <20)
HIV-1 RNA Quant, Log: 4 Log copies/mL — ABNORMAL HIGH (ref <1.30)

## 2016-04-24 LAB — URINE CYTOLOGY ANCILLARY ONLY
Chlamydia: NEGATIVE
Neisseria Gonorrhea: NEGATIVE

## 2016-04-24 LAB — T-HELPER CELL (CD4) - (RCID CLINIC ONLY)
CD4 T CELL ABS: 610 /uL (ref 400–2700)
CD4 T CELL HELPER: 30 % — AB (ref 33–55)

## 2016-04-24 LAB — RPR

## 2016-04-25 ENCOUNTER — Encounter: Payer: Self-pay | Admitting: Infectious Disease

## 2016-04-25 LAB — QUANTIFERON TB GOLD ASSAY (BLOOD)
INTERFERON GAMMA RELEASE ASSAY: NEGATIVE
MITOGEN-NIL SO: 8.56 [IU]/mL
QUANTIFERON TB AG MINUS NIL: 0.13 [IU]/mL
Quantiferon Nil Value: 0.79 IU/mL

## 2016-04-29 ENCOUNTER — Encounter: Payer: Self-pay | Admitting: Infectious Disease

## 2016-05-03 LAB — HLA B*5701: HLA-B 5701 W/RFLX HLA-B HIGH: NEGATIVE

## 2016-05-04 LAB — HIV-1 GENOTYPR PLUS

## 2016-05-15 ENCOUNTER — Encounter: Payer: Self-pay | Admitting: Infectious Disease

## 2016-05-15 ENCOUNTER — Ambulatory Visit (INDEPENDENT_AMBULATORY_CARE_PROVIDER_SITE_OTHER): Payer: Self-pay | Admitting: Infectious Disease

## 2016-05-15 ENCOUNTER — Ambulatory Visit: Payer: Self-pay | Admitting: *Deleted

## 2016-05-15 VITALS — BP 156/93 | HR 82 | Temp 97.8°F | Ht 74.0 in | Wt 200.8 lb

## 2016-05-15 DIAGNOSIS — Z23 Encounter for immunization: Secondary | ICD-10-CM

## 2016-05-15 DIAGNOSIS — B20 Human immunodeficiency virus [HIV] disease: Secondary | ICD-10-CM | POA: Insufficient documentation

## 2016-05-15 DIAGNOSIS — F329 Major depressive disorder, single episode, unspecified: Secondary | ICD-10-CM

## 2016-05-15 DIAGNOSIS — L818 Other specified disorders of pigmentation: Secondary | ICD-10-CM

## 2016-05-15 DIAGNOSIS — F32A Depression, unspecified: Secondary | ICD-10-CM

## 2016-05-15 DIAGNOSIS — G4700 Insomnia, unspecified: Secondary | ICD-10-CM

## 2016-05-15 DIAGNOSIS — E291 Testicular hypofunction: Secondary | ICD-10-CM | POA: Insufficient documentation

## 2016-05-15 DIAGNOSIS — L739 Follicular disorder, unspecified: Secondary | ICD-10-CM

## 2016-05-15 HISTORY — DX: Other specified disorders of pigmentation: L81.8

## 2016-05-15 HISTORY — DX: Depression, unspecified: F32.A

## 2016-05-15 HISTORY — DX: Testicular hypofunction: E29.1

## 2016-05-15 HISTORY — DX: Follicular disorder, unspecified: L73.9

## 2016-05-15 HISTORY — DX: Insomnia, unspecified: G47.00

## 2016-05-15 MED ORDER — DOXYCYCLINE HYCLATE 100 MG PO TABS: 100.0000 mg | ORAL_TABLET | Freq: Two times a day (BID) | ORAL | Status: AC

## 2016-05-15 MED ORDER — EMTRICITAB-RILPIVIR-TENOFOV AF 200-25-25 MG PO TABS: 1.0000 | ORAL_TABLET | Freq: Every day | ORAL | Status: DC

## 2016-05-15 NOTE — Patient Instructions (Signed)
I want you to make an appt with Pharmacy in the next 2 weeks

## 2016-05-15 NOTE — BH Specialist Note (Signed)
Counselor met with Jermaine Peterson today in the exam room for a warm hand off.  Patient is new and counselor introduced self and information about counseling services.  Patient was oriented times four with shy affect but neat dress.  Patient was alert and somewhat talkative. Counselor used reflective listening skills as patient described where his head has been lately since being diagnosed.  Patient shared that most days are ok but then there are those days where he does not cope well.  Counselor provided support and encouragement with patient today.  Counselor recommended that patient make an appointment to further process what is happening right now and what to expect in the future.  Patient agreed that it would be wise to talk to someone that he is not related too or knows about his illness.   [facility]

## 2016-05-15 NOTE — Progress Notes (Signed)
HPI: Jermaine Peterson is a 25 y.o. male who presents to the clinic today for follow-up with Dr. Zenaida NieceVan for his newly diagnosed HIV infection.  He is a new diagnosis and was taking Tivicay + Descovy but has had trouble with insomnia ever since starting those medications.    Allergies: Allergies  Allergen Reactions  . Nickel Other (See Comments)    Burns skin     Vitals: Temp: 97.8 F (36.6 C) (07/12 1345) Temp Source: Oral (07/12 1345) BP: 156/93 mmHg (07/12 1345) Pulse Rate: 82 (07/12 1345)  Past Medical History: No past medical history on file.  Social History: Social History   Social History  . Marital Status: Single    Spouse Name: N/A  . Number of Children: N/A  . Years of Education: N/A   Social History Main Topics  . Smoking status: Current Every Day Smoker -- 0.15 packs/day    Types: Cigarettes  . Smokeless tobacco: None  . Alcohol Use: Yes     Comment: occ  . Drug Use: None  . Sexual Activity: Not Currently   Other Topics Concern  . None   Social History Narrative    Previous Regimen: none  Current Regimen: Tivicay + Descovy  Labs: HIV 1 RNA QUANT (copies/mL)  Date Value  04/23/2016 9947*   CD4 T CELL ABS (/uL)  Date Value  04/23/2016 610   HEP B S AB (no units)  Date Value  04/23/2016 POS*   HEPATITIS B SURFACE AG (no units)  Date Value  04/23/2016 NEGATIVE   HCV AB (no units)  Date Value  04/23/2016 NEGATIVE    CrCl: CrCl cannot be calculated (Patient has no serum creatinine result on file.).  Lipids:    Component Value Date/Time   CHOL 139 04/23/2016 1025   TRIG 109 04/23/2016 1025   HDL 42 04/23/2016 1025   CHOLHDL 3.3 04/23/2016 1025   VLDL 22 04/23/2016 1025   LDLCALC 75 04/23/2016 1025    Assessment: Jermaine Peterson is having insomnia since starting Tivicay + Descovy. Dr. Daiva EvesVan Dam [facility].  I explained the medication to him and showed him what it looked like on the HIV medication chart.  I told him it had  to be taken with a high fat meal and he said that would not be a problem. He states he has been taking his medications daily without missing any doses, and has not had any other side effects besides the insomnia.  He said he had 9 days left of the Tivicay and Descovy and asked if he should finish out those meds - I told him it would be nice to finish those before starting Odefsey.  He will come back to see us on 8/30 before seeing Dr. Daiva EvesVan Dam.  He will need his second HPV vaccine when he comes back as well.  Plans: - Stop Tivicay and Descovy once you are finished with bottle - Start Odefsey 200/25/25 mg once daily with a meal - Follow up appointment with pharmacy and Dr. Daiva EvesVan Dam 8/30 at 9am  Cassie L. Hinton DyerStewart, BS, PharmD Infectious Diseases Clinical Pharmacist [facility] 05/15/2016, 2:59 PM

## 2016-05-15 NOTE — Progress Notes (Signed)
Chief complaints: insomnia, depression, folliculitis, rib pain  Subjective:    Patient ID: Jermaine Peterson, male    DOB: 18-Feb-1991, 25 y.o.   MRN: [id number]  HPI  25 year old Caucasian man with recently diagnosed HIV whom we evaluated for possible acute infection with ACTG and whom I had rx Tivicay and Descovy via [employer] until he could get [employer] and ADAP filled out.  His virus at baseline was low and his genotype wildtype. He is Hep B negative and HLA b701 negative.   Since starting his Tivicay and Descovy he has slept poorly despite taking the meds in the am.  He has had worsening of this depression though some of this is related to his diagnosis of HIV itself.   He has developed folliculitis on his left leg where he had been picking at are that was inflamed and itching now tender but without drainage. He has also been complaining of sharp pain in his right side at times that he wonders could be due to the medications.  Past Medical History  Diagnosis Date  . Folliculitis 7/94/8016  . Hypogonadism in male 05/15/2016  . Tattoos 05/15/2016  . Depression 05/15/2016  . Insomnia 05/15/2016    Past Surgical History  Procedure Laterality Date  . Hernia repair Left     inguinal    Family History  Problem Relation Age of Onset  . Cancer Mother   . Hypertension Mother   . Cancer Father   . Hypertension Father       Social History   Social History  . Marital Status: Single    Spouse Name: N/A  . Number of Children: N/A  . Years of Education: N/A   Social History Main Topics  . Smoking status: Current Every Day Smoker -- 0.15 packs/day    Types: Cigarettes  . Smokeless tobacco: None  . Alcohol Use: Yes     Comment: occ  . Drug Use: None  . Sexual Activity: Not Currently   Other Topics Concern  . None   Social History Narrative    Allergies  Allergen Reactions  . Nickel Other (See Comments)    Burns skin      Current outpatient prescriptions:  .   acetaminophen (TYLENOL) 325 MG tablet, Take 650 mg by mouth every 6 (six) hours as needed for moderate pain or fever. Reported on 05/15/2016, Disp: , Rfl:  .  doxycycline (VIBRA-TABS) 100 MG tablet, Take 1 tablet (100 mg total) by mouth 2 (two) times daily., Disp: 20 tablet, Rfl: 1 .  emtricitabine-rilpivir-tenofovir AF (ODEFSEY) 200-25-25 MG TABS tablet, Take 1 tablet by mouth daily with breakfast., Disp: 30 tablet, Rfl: 11 .  OVER THE COUNTER MEDICATION, Take 2 tablets by mouth daily. Reported on 05/15/2016, Disp: , Rfl:  .  testosterone (ANDROGEL) 50 MG/5GM (1%) GEL, Place 5 g onto the skin daily. Reported on 05/15/2016, Disp: , Rfl:    Review of Systems  Constitutional: Negative for fever, chills, diaphoresis, activity change, appetite change, fatigue and unexpected weight change.  HENT: Negative for congestion, rhinorrhea, sinus pressure, sneezing, sore throat and trouble swallowing.   Eyes: Negative for photophobia and visual disturbance.  Respiratory: Negative for cough, chest tightness, shortness of breath, wheezing and stridor.   Cardiovascular: Negative for chest pain, palpitations and leg swelling.  Gastrointestinal: Negative for nausea, vomiting, abdominal pain, diarrhea, constipation, blood in stool, abdominal distention and anal bleeding.  Genitourinary: Negative for dysuria, hematuria, flank pain and difficulty urinating.  Musculoskeletal:  Negative for myalgias, back pain, joint swelling, arthralgias and gait problem.  Skin: Positive for rash. Negative for color change, pallor and wound.  Neurological: Negative for dizziness, tremors, weakness and light-headedness.  Hematological: Negative for adenopathy. Does not bruise/bleed easily.  Psychiatric/Behavioral: Positive for sleep disturbance and dysphoric mood. Negative for behavioral problems, confusion, self-injury, decreased concentration and agitation. The patient is nervous/anxious.        Objective:   Physical Exam    Constitutional: He is oriented to person, place, and time. He appears well-developed and well-nourished.  HENT:  Head: Normocephalic and atraumatic.  Eyes: Conjunctivae and EOM are normal.  Neck: Normal range of motion. Neck supple.  Cardiovascular: Normal rate and regular rhythm.   Pulmonary/Chest: Effort normal. No respiratory distress. He has no wheezes.  Abdominal: Soft. He exhibits no distension.  Musculoskeletal: Normal range of motion. He exhibits no edema or tenderness.  Neurological: He is alert and oriented to person, place, and time.  Skin: Skin is warm and dry. Rash noted. No erythema. No pallor.  Psychiatric: His speech is normal and behavior is normal. Judgment and thought content normal. His mood appears anxious. Cognition and memory are normal. He exhibits a depressed mood.   Folliculitis of left thigh       Assessment & Plan:   HIV disease: he had recent very symptomatic infection but not acute by labs. He is having trouble with insomnia with the Tivicay and Descovy. We will dc this and change him to [facility] with chewable food and avoidance of antacids   I will have him meet with pharmacy in next 2 weeks recheck labs today and in 4 weeks  Folliculitis: rx with doxycline x 10 days  Depression: had him meet with Leveda Anna. Could benefit from Peer Support, consider SSRI  Hypogonadism: did not address but saw he has been on testosterone  Insomnia: hopefully improves with move away from DTG  Flank pain: this is likely related to hyper awareness and anxiety. I do not see how this could otherwise be related to his meds.   I spent greater than 60 minutes with the patient including greater than 50% of time in face to face counsel of the patient re his HIV, nature of HIV his ARV regimen other options including the new regimen, his insomnia, his depression and insomnia and in coordination of his care.

## 2016-05-16 LAB — T-HELPER CELL (CD4) - (RCID CLINIC ONLY)
CD4 T CELL ABS: 590 /uL (ref 400–2700)
CD4 T CELL HELPER: 28 % — AB (ref 33–55)

## 2016-05-18 LAB — HIV RNA, RTPCR W/R GT (RTI, PI,INT)
HIV 1 RNA Quant: <20 copies/mL
HIV-1 RNA Quant, Log: <1.3 Log copies/mL

## 2016-05-28 ENCOUNTER — Telehealth: Payer: Self-pay | Admitting: *Deleted

## 2016-05-28 NOTE — Telephone Encounter (Signed)
-----   Message from Randall Hiss, MD sent at 05/18/2016  6:29 PM EDT ----- Can we let know that [name] know that he is with undetectable viral load now on the Tivicay and Descovy. He may not have loved this initial regimen but it has gotten him to undetectable already

## 2016-05-28 NOTE — Telephone Encounter (Signed)
Called patient to give him MD message. No answer and no voice mail.

## 2016-06-05 ENCOUNTER — Encounter: Payer: Self-pay | Admitting: Licensed Clinical Social Worker

## 2016-07-03 ENCOUNTER — Ambulatory Visit (INDEPENDENT_AMBULATORY_CARE_PROVIDER_SITE_OTHER): Payer: Self-pay | Admitting: Infectious Disease

## 2016-07-03 ENCOUNTER — Ambulatory Visit: Payer: Self-pay

## 2016-07-03 ENCOUNTER — Encounter: Payer: Self-pay | Admitting: Infectious Disease

## 2016-07-03 VITALS — BP 135/80 | HR 58 | Temp 98.1°F | Ht 74.0 in | Wt 211.2 lb

## 2016-07-03 DIAGNOSIS — B20 Human immunodeficiency virus [HIV] disease: Secondary | ICD-10-CM

## 2016-07-03 DIAGNOSIS — F32A Depression, unspecified: Secondary | ICD-10-CM

## 2016-07-03 DIAGNOSIS — Z23 Encounter for immunization: Secondary | ICD-10-CM

## 2016-07-03 DIAGNOSIS — G4700 Insomnia, unspecified: Secondary | ICD-10-CM

## 2016-07-03 DIAGNOSIS — R635 Abnormal weight gain: Secondary | ICD-10-CM

## 2016-07-03 DIAGNOSIS — L818 Other specified disorders of pigmentation: Secondary | ICD-10-CM

## 2016-07-03 DIAGNOSIS — F329 Major depressive disorder, single episode, unspecified: Secondary | ICD-10-CM

## 2016-07-03 DIAGNOSIS — Z113 Encounter for screening for infections with a predominantly sexual mode of transmission: Secondary | ICD-10-CM

## 2016-07-03 HISTORY — DX: Abnormal weight gain: R63.5

## 2016-07-03 LAB — CBC WITH DIFFERENTIAL/PLATELET
BASOS ABS: 32 {cells}/uL (ref 0–200)
Basophils Relative: 1 %
EOS PCT: 5 %
Eosinophils Absolute: 160 cells/uL (ref 15–500)
HCT: 42.9 % (ref 38.5–50.0)
Hemoglobin: 14.8 g/dL (ref 13.2–17.1)
Lymphocytes Relative: 43 %
Lymphs Abs: 1376 cells/uL (ref 850–3900)
MCH: 32.5 pg (ref 27.0–33.0)
MCHC: 34.5 g/dL (ref 32.0–36.0)
MCV: 94.1 fL (ref 80.0–100.0)
MONOS PCT: 14 %
MPV: 9 fL (ref 7.5–12.5)
Monocytes Absolute: 448 cells/uL (ref 200–950)
NEUTROS ABS: 1184 {cells}/uL — AB (ref 1500–7800)
Neutrophils Relative %: 37 %
PLATELETS: 242 10*3/uL (ref 140–400)
RBC: 4.56 MIL/uL (ref 4.20–5.80)
RDW: 13.6 % (ref 11.0–15.0)
WBC: 3.2 10*3/uL — AB (ref 3.8–10.8)

## 2016-07-03 LAB — COMPLETE METABOLIC PANEL WITH GFR
ALBUMIN: 4.2 g/dL (ref 3.6–5.1)
ALK PHOS: 54 U/L (ref 40–115)
ALT: 16 U/L (ref 9–46)
AST: 14 U/L (ref 10–40)
BILIRUBIN TOTAL: 0.6 mg/dL (ref 0.2–1.2)
BUN: 14 mg/dL (ref 7–25)
CALCIUM: 8.8 mg/dL (ref 8.6–10.3)
CO2: 26 mmol/L (ref 20–31)
Chloride: 101 mmol/L (ref 98–110)
Creat: 1.1 mg/dL (ref 0.60–1.35)
GLUCOSE: 84 mg/dL (ref 65–99)
Potassium: 4.3 mmol/L (ref 3.5–5.3)
Sodium: 137 mmol/L (ref 135–146)
TOTAL PROTEIN: 6.6 g/dL (ref 6.1–8.1)

## 2016-07-03 MED ORDER — EMTRICITABINE-TENOFOVIR AF 200-25 MG PO TABS: 1.0000 | ORAL_TABLET | Freq: Every day | ORAL | 11 refills | Status: DC

## 2016-07-03 MED ORDER — DOLUTEGRAVIR SODIUM 50 MG PO TABS: 50.0000 mg | ORAL_TABLET | Freq: Every day | ORAL | 11 refills | Status: DC

## 2016-07-03 MED ORDER — HPV QUADRIVALENT VACCINE IM SUSP: 0.5000 mL | Freq: Once | INTRAMUSCULAR | 0 refills | Status: DC

## 2016-07-03 NOTE — Addendum Note (Signed)
Addended by: Mariea ClontsGREEN, Sherisse Fullilove D on: 07/03/2016 04:36 PM   Modules accepted: Orders

## 2016-07-03 NOTE — Progress Notes (Signed)
Chief complaints: Weight gain  Subjective:    Patient ID: Jermaine Peterson, male    DOB: 1991/02/03, 25 y.o.   MRN: [id number]  HPI  25year old Caucasian man with recently diagnosed HIV whom we evaluated for possible acute infection with ACTG and whom I had rx Tivicay and Descovy via [employer] until he could get [employer] and ADAP filled out.  His virus at baseline was low and his genotype wildtype. He is Hep B negative and HLA b701 negative.   Since starting his Tivicay and Descovy he has slept poorly despite taking the meds in the am.  He has had worsening of this depression though some of this is related to his diagnosis of HIV itself.   He has developed folliculitis on his left leg where he had been picking at are that was inflamed and itching  He has also been complaining of sharp pain in his right side at times that he wondered  could be due to the medications.  He had attained a undetectable viral load on this regimen but we switched him over to [facility] which he has tolerated well. However he dislikes having to remember to take the medicine with food and has missed medicines more frequently than he did with difficulty and DESCOVY where he never missed a dose. He had an extensive discussion decided to try back the regimen of activity and DESCOVY. He would also be a candidate for [place].  His depression is improved substantially and he says he is doing very well does not need to meet with counselor today. I did have him meet with Audelia Hives well though.  Past Medical History:  Diagnosis Date  . Depression 05/15/2016  . Folliculitis 07/24/1006  . Hypogonadism in male 05/15/2016  . Insomnia 05/15/2016  . Tattoos 05/15/2016    Past Surgical History:  Procedure Laterality Date  . HERNIA REPAIR Left    inguinal    Family History  Problem Relation Age of Onset  . Cancer Mother   . Hypertension Mother   . Cancer Father   . Hypertension Father       Social History   Social  History  . Marital status: Single    Spouse name: N/A  . Number of children: N/A  . Years of education: N/A   Social History Main Topics  . Smoking status: Current Every Day Smoker    Packs/day: 0.15    Types: Cigarettes  . Smokeless tobacco: Never Used  . Alcohol use 3.6 oz/week    6 Standard drinks or equivalent per week     Comment: 2x/week  . Drug use:     Frequency: 7.0 times per week    Types: Hydromorphone, Marijuana  . Sexual activity: Not Currently    Partners: Male   Other Topics Concern  . None   Social History Narrative  . None    Allergies  Allergen Reactions  . Nickel Other (See Comments)    Burns skin      Current Outpatient Prescriptions:  .  acetaminophen (TYLENOL) 325 MG tablet, Take 650 mg by mouth every 6 (six) hours as needed for moderate pain or fever. Reported on 05/15/2016, Disp: , Rfl:  .  dolutegravir (TIVICAY) 50 MG tablet, Take 1 tablet (50 mg total) by mouth daily., Disp: 30 tablet, Rfl: 11 .  doxycycline (VIBRA-TABS) 100 MG tablet, Take 1 tablet (100 mg total) by mouth 2 (two) times daily., Disp: 20 tablet, Rfl: 1 .  emtricitabine-tenofovir AF (DESCOVY)  200-25 MG tablet, Take 1 tablet by mouth daily., Disp: 30 tablet, Rfl: 11 .  OVER THE COUNTER MEDICATION, Take 2 tablets by mouth daily. Reported on 05/15/2016, Disp: , Rfl:  .  testosterone (ANDROGEL) 50 MG/5GM (1%) GEL, Place 5 g onto the skin daily. Reported on 05/15/2016, Disp: , Rfl:    Review of Systems  Constitutional: Positive for unexpected weight change. Negative for activity change, appetite change, chills, diaphoresis, fatigue and fever.  HENT: Negative for congestion, rhinorrhea, sinus pressure, sneezing, sore throat and trouble swallowing.   Eyes: Negative for photophobia and visual disturbance.  Respiratory: Negative for cough, chest tightness, shortness of breath, wheezing and stridor.   Cardiovascular: Negative for chest pain, palpitations and leg swelling.  Gastrointestinal:  Negative for abdominal distention, abdominal pain, anal bleeding, blood in stool, constipation, diarrhea, nausea and vomiting.  Genitourinary: Negative for difficulty urinating, dysuria, flank pain and hematuria.  Musculoskeletal: Negative for arthralgias, back pain, gait problem, joint swelling and myalgias.  Skin: Negative for color change, pallor, rash and wound.  Neurological: Negative for dizziness, tremors, weakness and light-headedness.  Hematological: Negative for adenopathy. Does not bruise/bleed easily.  Psychiatric/Behavioral: Negative for agitation, behavioral problems, confusion, decreased concentration, dysphoric mood, self-injury and sleep disturbance. The patient is not nervous/anxious.        Objective:   Physical Exam  Constitutional: He is oriented to person, place, and time. He appears well-developed and well-nourished.  HENT:  Head: Normocephalic and atraumatic.  Eyes: Conjunctivae and EOM are normal.  Neck: Normal range of motion. Neck supple.  Cardiovascular: Normal rate and regular rhythm.   Pulmonary/Chest: Effort normal. No respiratory distress. He has no wheezes.  Abdominal: Soft. He exhibits no distension.  Musculoskeletal: Normal range of motion. He exhibits no edema or tenderness.  Neurological: He is alert and oriented to person, place, and time.  Skin: Skin is warm and dry. Rash noted. No erythema. No pallor.  Psychiatric: His speech is normal and behavior is normal. Judgment and thought content normal. His mood appears anxious. Cognition and memory are normal. He exhibits a depressed mood.   Folliculitis of left thigh       Assessment & Plan:   HIV disease: switch back to  Tivicay and Descovy so he doesn't have to worry about food requirements. If he doesn't tolerate this then consider Genvoya with small amt of food   Depression: says he is doing much better  Insomnia: hopefully will not worsen with DTG on board again   I spent greater than 40  minutes with the patient including greater than 50% of time in face to face counsel of the patient re his HIV, nature of HIV his ARV regimenshis insomnia, his depression and insomnia and in coordination of his care.

## 2016-07-04 LAB — RPR

## 2016-07-04 LAB — T-HELPER CELL (CD4) - (RCID CLINIC ONLY)
CD4 T CELL ABS: 440 /uL (ref 400–2700)
CD4 T CELL HELPER: 34 % — AB (ref 33–55)

## 2016-07-06 LAB — HIV RNA, RTPCR W/R GT (RTI, PI,INT): HIV-1 RNA Quant, Log: <1.3 Log copies/mL

## 2016-07-23 ENCOUNTER — Telehealth: Payer: Self-pay

## 2016-07-23 NOTE — Telephone Encounter (Signed)
[facility] which e disliked because of food but which gave him ZERO significant side effects but he MUST take it with chewable food.  That would be the most likely NOT to cause side effects for him but he will need to deal with the food part  Another similar [facility] to [facility] would be TWO Intelence tablets with Descovy and zero food requrements  Finally we could always go to Prezcobix and Descovy vs Genvoya keeping in mind that if he Is having insomnia due to integrase then this would NOT be the best option.  Is he on the schedule for Thursday? He can also come and meet with Pharmacy if he likes

## 2016-07-23 NOTE — Telephone Encounter (Signed)
Patient called with complaints of insomnia, depression, blisters in his mouth, and rash of pimples across his chest. He is also complaining of having raw skin under his beard. Patient denies shortness of breath or throat swelling. Patient also denies any suicidal thoughts.  Patient states there is no chance of him having a STD due to not having any type of sex since diagnosis. He denies missing any dose of medication. Went and informed pharmacist Cassie of complaints. Pharmacist stated for him to continue taking medications until seen on Thursday. First available appointment offered but declined due to patient's booking schedule. Next available given. Patient aware. Rejeana Brockandace Murray, LPN

## 2016-07-25 ENCOUNTER — Ambulatory Visit: Payer: Self-pay | Admitting: Pharmacist

## 2016-07-25 DIAGNOSIS — B20 Human immunodeficiency virus [HIV] disease: Secondary | ICD-10-CM

## 2016-07-25 MED ORDER — ETRAVIRINE 200 MG PO TABS: 400.0000 mg | ORAL_TABLET | Freq: Every day | ORAL | 3 refills | Status: DC

## 2016-07-25 NOTE — Telephone Encounter (Signed)
I'll review options with him this morning when he comes in

## 2016-07-25 NOTE — Telephone Encounter (Signed)
THanks Cassie!

## 2016-07-25 NOTE — Progress Notes (Signed)
HPI: Jermaine Peterson is a 25 y.o. male who presents to the [facility] pharmacy today for follow-up of side effects he's having from his HIV medications. He was originally started on Tivicay + Descovy in July but was having trouble with insomnia, so he was changed to [facility] shortly thereafter.  He saw Dr. Daiva Eves at the end of August and did not like the food requirement associated with [facility] as he has a sporadic schedule being a hair stylist, so he was changed back to Tivicay + Descovy.  He called earlier this week with symptoms of rash, insomnia, depression, and headaches, so he presents today to see me for discussion of other treatment options.   Allergies: Allergies  Allergen Reactions  . Nickel Other (See Comments)    Burns skin     Past Medical History: Past Medical History:  Diagnosis Date  . Depression 05/15/2016  . Folliculitis 05/15/2016  . Hypogonadism in male 05/15/2016  . Insomnia 05/15/2016  . Tattoos 05/15/2016  . Weight gain 07/03/2016    Social History: Social History   Social History  . Marital status: Single    Spouse name: N/A  . Number of children: N/A  . Years of education: N/A   Social History Main Topics  . Smoking status: Current Every Day Smoker    Packs/day: 0.15    Types: Cigarettes  . Smokeless tobacco: Never Used  . Alcohol use 3.6 oz/week    6 Standard drinks or equivalent per week     Comment: 2x/week  . Drug use:     Frequency: 7.0 times per week    Types: Hydromorphone, Marijuana  . Sexual activity: Not Currently    Partners: Male   Other Topics Concern  . Not on file   Social History Narrative  . No narrative on file    Current Regimen: Tivicay + Descovy  Labs: HIV 1 RNA Quant (copies/mL)  Date Value  07/03/2016 <20  05/15/2016 <20  04/23/2016 9,947 (H)   CD4 T Cell Abs (/uL)  Date Value  07/03/2016 440  05/15/2016 590  04/23/2016 610   Hep B S Ab (no units)  Date Value  04/23/2016 POS (A)   Hepatitis B Surface Ag (no  units)  Date Value  04/23/2016 NEGATIVE   HCV Ab (no units)  Date Value  04/23/2016 NEGATIVE    CrCl: CrCl cannot be calculated (Unknown ideal weight.).  Lipids:    Component Value Date/Time   CHOL 139 04/23/2016 1025   TRIG 109 04/23/2016 1025   HDL 42 04/23/2016 1025   CHOLHDL 3.3 04/23/2016 1025   VLDL 22 04/23/2016 1025   LDLCALC 75 04/23/2016 1025    Assessment: Jermaine Peterson is here today with side effects associated with his Tivicay + Descovy. He was reading about Descovy and wondered if he was having an increase in lactic acid leading to all his symptoms.  I explained to him that all HIV medications have a warning for lactic acidosis but it is mainly associated with the older medications and that I thought Descovy would not be causing it.  I offered starting a low dose antidepressant for his depression, but he would like to hold off for now and try other HIV medications first.   I discussed different treatment options with him. I told him that I thought his insomnia, headaches, and rash were associated with the Tivicay.  I explained to him that he could go back to [place], switch to [place], or try Intelence + Descovy.  I also explained that if he was having the side effects due to Tivicay that Genvoya would probably cause the same effects.  He elected to try the Intelence (2 tablets once daily) and Descovy. I sent his medications to [facility] and made a f/u apt with pharmacy in 2 weeks to assess how he's doing.   Plans: - Stop Tivicay - Continue Descovy 1 pill once daily - Start Intelence 200 mg - take 2 pills (400 mg total) once daily - F/u with pharmacy 10/3 at 9:30am - F/u with Dr. Daiva EvesVan Dam 10/25 at 9:45am  Cassie L. Hinton DyerStewart, BS, PharmD Infectious Diseases Clinical Pharmacist [facility] 07/25/2016, 10:56 AM

## 2016-07-25 NOTE — Telephone Encounter (Signed)
He's coming in this morning.  I'll review options with him and see what he would like to do.

## 2016-07-30 ENCOUNTER — Telehealth: Payer: Self-pay | Admitting: Pharmacist

## 2016-07-30 NOTE — Telephone Encounter (Signed)
Jermaine Peterson called me today because his feet have been numb/tingling for a day now. He tells me at first he thought it was because he was on his feet all day at work (he works at a [employer]), but that he went to bed and it is still happening and going on and it is worrying him.  He said at one point he had to take off his shoe to make sure his toes were not blue. It is really bothering him and worrying him.  He wanted to me discuss with Dr. Daiva EvesVan Dam. Of note, he is still taking his Tivicay and Descovy.  He has not started the Intelence yet. Will discuss with Dr. Daiva EvesVan Dam this afternoon.  Greta Yung L. Hinton DyerStewart, BS, PharmD Infectious Diseases Clinical Pharmacist [facility] 07/30/2016, 11:17 AM

## 2016-07-30 NOTE — Telephone Encounter (Signed)
Spoke with Dr. Daiva EvesVan Dam regarding Kayler's feet numbness.  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  I explained this to [name] and instructed him to go ahead and start the Intelence with the Descovy and hopefully the numbness will subside.  If not, we can start him on Neurontin or Lyrica. He will let me know in the next coming days. Dr. Daiva EvesVan Dam aware.  Cassie L. Hinton DyerStewart, BS, PharmD Infectious Diseases Clinical Pharmacist [facility] 07/30/2016, 4:35 PM

## 2016-07-30 NOTE — Telephone Encounter (Signed)
Excellent

## 2016-08-06 ENCOUNTER — Ambulatory Visit: Payer: Self-pay | Admitting: Pharmacist

## 2016-08-06 DIAGNOSIS — F321 Major depressive disorder, single episode, moderate: Secondary | ICD-10-CM

## 2016-08-06 MED ORDER — PAROXETINE HCL 20 MG PO TABS: 20.0000 mg | ORAL_TABLET | Freq: Every day | ORAL | 3 refills | Status: DC

## 2016-08-06 NOTE — Progress Notes (Signed)
HPI: Jermaine Peterson is a 25 y.o. male who presents to the [facility] pharmacy clinic for follow-up of his HIV.  He recently switched from Tivicay + Descovy to Intelence + Descovy. He called me last week complaining of feet numbness and pain. He comes in today just to check in.   Allergies: Allergies  Allergen Reactions  . Nickel Other (See Comments)    Burns skin     Past Medical History: Past Medical History:  Diagnosis Date  . Depression 05/15/2016  . Folliculitis 05/15/2016  . Hypogonadism in male 05/15/2016  . Insomnia 05/15/2016  . Tattoos 05/15/2016  . Weight gain 07/03/2016    Social History: Social History   Social History  . Marital status: Single    Spouse name: N/A  . Number of children: N/A  . Years of education: N/A   Social History Main Topics  . Smoking status: Current Every Day Smoker    Packs/day: 0.15    Types: Cigarettes  . Smokeless tobacco: Never Used  . Alcohol use 3.6 oz/week    6 Standard drinks or equivalent per week     Comment: 2x/week  . Drug use:     Frequency: 7.0 times per week    Types: Hydromorphone, Marijuana  . Sexual activity: Not Currently    Partners: Male   Other Topics Concern  . Not on file   Social History Narrative  . No narrative on file    Current Regimen: Intelence + Descovy  Labs: HIV 1 RNA Quant (copies/mL)  Date Value  07/03/2016 <20  05/15/2016 <20  04/23/2016 9,947 (H)   CD4 T Cell Abs (/uL)  Date Value  07/03/2016 440  05/15/2016 590  04/23/2016 610   Hep B S Ab (no units)  Date Value  04/23/2016 POS (A)   Hepatitis B Surface Ag (no units)  Date Value  04/23/2016 NEGATIVE   HCV Ab (no units)  Date Value  04/23/2016 NEGATIVE    CrCl: CrCl cannot be calculated (Unknown ideal weight.).  Lipids:    Component Value Date/Time   CHOL 139 04/23/2016 1025   TRIG 109 04/23/2016 1025   HDL 42 04/23/2016 1025   CHOLHDL 3.3 04/23/2016 1025   VLDL 22 04/23/2016 1025   LDLCALC 75 04/23/2016 1025     Assessment: Jermaine Peterson is here today to check in since changing HIV medications.  He is very stressed this morning and worried about his mood lately. He said he "lost it" last week and had a complete meltdown to the point where he had to call his brother and his brother had to talk him down for ~2 hours.  He does tell me that he was never suicidal or homicidal, but he was going crazy, punching walls, telling his friends off and to get out of his house and that he is extremely worried because he is normally not like that.  He is also feeling depressed more lately.  He states he doesn't think he ever processed his diagnosis and dealt with it appropriately, so now it is really getting to him.  He is telling me that he is ready to speak to someone about his feelings and emotions, and he is also asking for something to take for his mood.    I went through his medications with him and reminded him what to do if he ever is suicidal or homicidal.  I will start him on a low dose antidepressant at this point.  Celexa interacts with his Intelence, so I  will start Paxil 20 mg and let Dr. Daiva Eves increase the dose when he sees him on 10/25.  I educated him on potential side effects of Paxil and reminded him that it may take several weeks to months to see full effect of his antidepressant.  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 He is not interested in taking anything for his neuropathy at this time.   I went up front and made an appointment with Jermaine Peterson for next week.  He tells me he is tolerating the Intelence and Descovy well since starting on it last week.  Plans: - Continue Intelence and Descovy - Start Paxil 20 mg PO daily and titrate up as needed - Appointment with Jermaine Peterson 10/11 at 10am - F/u with Dr. Daiva Eves 10/25 at  9:45am  Jermaine Peterson, PharmD Infectious Diseases Clinical Pharmacist [facility] 08/06/2016, 3:00 PM

## 2016-08-14 ENCOUNTER — Ambulatory Visit: Payer: Self-pay | Admitting: *Deleted

## 2016-08-14 ENCOUNTER — Ambulatory Visit: Payer: Self-pay

## 2016-08-14 DIAGNOSIS — F1290 Cannabis use, unspecified, uncomplicated: Secondary | ICD-10-CM

## 2016-08-14 DIAGNOSIS — F1410 Cocaine abuse, uncomplicated: Secondary | ICD-10-CM

## 2016-08-14 DIAGNOSIS — F4323 Adjustment disorder with mixed anxiety and depressed mood: Secondary | ICD-10-CM

## 2016-08-14 DIAGNOSIS — F1010 Alcohol abuse, uncomplicated: Secondary | ICD-10-CM

## 2016-08-14 NOTE — BH Specialist Note (Signed)
Jermaine Peterson was present today for his scheduled appointment. Patient was oriented times four with good affect and dress.  Patient was alert and talkative. Patient shared that he starting taking an anti depressant prescribed to him recently and is waiting for it to start working.  Counselor strongly encouraged patient to consider the substances he was using other wise to cope.  Patient reported that he is presently using "blow" (cocaine), marijuana, and alcohol.  Patient shared that he stopped using the large amounts of alcohol since the melt down as he feels it had a lot to do with his outbursts lately.  Patient stated that his drinking has accelerated lately due to finding out he is HIV positive. Patient feels that he has not processed thoroughly his diagnosis and believes that his inability to cope lately is a direct result. Counselor provided support and encouragement for patient.  Patient shared a serious family history of substance abuse.  Patient says that he just picked his brother up from a 30 day rehab stint. Patient said that alcoholism ran in the family.  Counselor recommended that patient consider an outpatient treatment program.  Patient said that he felt that might be a good idea to help him with his efforts toward sobriety. Patient said that he would always continue to use marijuana as he does not feel it is a real drug. Counselor educated about other coping tools he could use instead of illegal or mind altering substances. Patient agreed that he fears his substance use because of the family history and he does not want to wind up like them. After the causative factors were identified by counselor and patient, plans were made to participate in counseling to further process and come to terms with diagnosis and consider outpatient treatment for the substance abuse. Another appointment with counselor two weeks out was made.  Jermaine LucksJodi Edla Para, MA, LPC [facility]

## 2016-08-15 ENCOUNTER — Encounter: Payer: Self-pay | Admitting: Infectious Disease

## 2016-08-28 ENCOUNTER — Ambulatory Visit: Payer: Self-pay | Admitting: *Deleted

## 2016-08-28 ENCOUNTER — Encounter: Payer: Self-pay | Admitting: Infectious Disease

## 2016-08-28 ENCOUNTER — Ambulatory Visit (INDEPENDENT_AMBULATORY_CARE_PROVIDER_SITE_OTHER): Payer: Self-pay | Admitting: Infectious Disease

## 2016-08-28 DIAGNOSIS — B20 Human immunodeficiency virus [HIV] disease: Secondary | ICD-10-CM

## 2016-08-28 DIAGNOSIS — F32A Depression, unspecified: Secondary | ICD-10-CM

## 2016-08-28 DIAGNOSIS — F1910 Other psychoactive substance abuse, uncomplicated: Secondary | ICD-10-CM

## 2016-08-28 DIAGNOSIS — F329 Major depressive disorder, single episode, unspecified: Secondary | ICD-10-CM

## 2016-08-28 DIAGNOSIS — F321 Major depressive disorder, single episode, moderate: Secondary | ICD-10-CM

## 2016-08-28 HISTORY — DX: Other psychoactive substance abuse, uncomplicated: F19.10

## 2016-08-28 MED ORDER — EMTRICITABINE-TENOFOVIR AF 200-25 MG PO TABS: 1.0000 | ORAL_TABLET | Freq: Every day | ORAL | 11 refills | Status: DC

## 2016-08-28 MED ORDER — PAROXETINE HCL 20 MG PO TABS: 20.0000 mg | ORAL_TABLET | Freq: Every day | ORAL | 11 refills | Status: DC

## 2016-08-28 MED ORDER — ETRAVIRINE 200 MG PO TABS: 400.0000 mg | ORAL_TABLET | Freq: Every day | ORAL | 11 refills | Status: DC

## 2016-08-28 NOTE — BH Specialist Note (Signed)
Counselor met with Jermaine Peterson in the exam room for a warm hand off per Dr. [name] request.  Patient communicated that he would like to find support groups for his alcoholism that is not religious oriented. Counselor shared with patient that most AA support groups are based on a 12 step spiritual/Christian perspective.  Counselor recommended that he perhaps consider taking some outpatient substance abuse classes that meet a couple of times a week for about an hour to assist with his sobriety but will not have a religious connotation. Patient said he would think about it and get back to me when he had made a decision. Otherwise patient reported that he had been sober for 24 days and was feeling really proud of himself as a result.  Patient said that he stopped smoking cigarettes as well and is more happy about stopping that.  Patient stated that he is just feeling good about the changes he has made thus far and continues to do whatever is necessary to continue down the same positive path.  Counselor gave patient props and encouraged him to stay in touch and keep up the good work.   Rolena Infante, MA, LPC [facility]

## 2016-08-28 NOTE — Progress Notes (Signed)
Chief complaints: followup for HIV  Subjective:    Patient ID: Jermaine Peterson, male    DOB: 1990-12-29, 25 y.o.   MRN: [id number]  HPI  25y ear old Caucasian man with recently diagnosed HIV whom we evaluated for possible acute infection with ACTG and whom I had rx Tivicay and Descovy via [employer] until he could get [employer] and ADAP filled out.  His virus at baseline was low and his genotype wildtype. He is Hep B negative and HLA b701 negative.   Since starting his Tivicay and Descovy he has slept poorly despite taking the meds in the am.  He has had worsening of this depression though some of this is related to his diagnosis of HIV itself.   He has developed folliculitis on his left leg where he had been picking at are that was inflamed and itching  He has also been complaining of sharp pain in his right side at times that he wondered  could be due to the medications.  He had attained a undetectable viral load on this regimen but we switched him over to [facility] which he has tolerated well. However he disliked having to remember to take the medicine with food. We then switched him back to [place] but he tolerated it poorly again and we went to Intelence 480m daily with Descovy.  We need repeat labs on him. He has been suffering from severe depression with suicidal ideation but much better now on Paxil 20 mg daily  He also was drinking heavily and doing other drugs but he has cut them all out for past 24 days except for marijuana.    He is willing to meet with JLeveda Annabut does not want religious verbiage in his counseling so much and is seeking AA without the religious overtones.   Past Medical History:  Diagnosis Date  . Depression 05/15/2016  . Folliculitis 70/94/7096 . Hypogonadism in male 05/15/2016  . Insomnia 05/15/2016  . Tattoos 05/15/2016  . Weight gain 07/03/2016    Past Surgical History:  Procedure Laterality Date  . HERNIA REPAIR Left    inguinal     Family History  Problem Relation Age of Onset  . Cancer Mother   . Hypertension Mother   . Cancer Father   . Hypertension Father       Social History   Social History  . Marital status: Single    Spouse name: N/A  . Number of children: N/A  . Years of education: N/A   Social History Main Topics  . Smoking status: Former Smoker    Packs/day: 0.15    Types: Cigarettes    Quit date: 08/04/2016  . Smokeless tobacco: Never Used  . Alcohol use No     Comment: stopped 08/04/2016  . Drug use:     Frequency: 7.0 times per week    Types: Marijuana  . Sexual activity: Not Currently    Partners: Male   Other Topics Concern  . None   Social History Narrative  . None    Allergies  Allergen Reactions  . Nickel Other (See Comments)    Burns skin      Current Outpatient Prescriptions:  .  emtricitabine-tenofovir AF (DESCOVY) 200-25 MG tablet, Take 1 tablet by mouth daily., Disp: 30 tablet, Rfl: 11 .  Etravirine (INTELENCE) 200 MG TABS, Take 2 tablets (400 mg total) by mouth daily., Disp: 60 tablet, Rfl: 3 .  PARoxetine (PAXIL) 20 MG tablet, Take 1  tablet (20 mg total) by mouth daily., Disp: 30 tablet, Rfl: 3 .  acetaminophen (TYLENOL) 325 MG tablet, Take 650 mg by mouth every 6 (six) hours as needed for moderate pain or fever. Reported on 05/15/2016, Disp: , Rfl:  .  doxycycline (VIBRA-TABS) 100 MG tablet, Take 1 tablet (100 mg total) by mouth 2 (two) times daily. (Patient not taking: Reported on 08/28/2016), Disp: 20 tablet, Rfl: 1 .  OVER THE COUNTER MEDICATION, Take 2 tablets by mouth daily. Reported on 05/15/2016, Disp: , Rfl:  .  testosterone (ANDROGEL) 50 MG/5GM (1%) GEL, Place 5 g onto the skin daily. Reported on 05/15/2016, Disp: , Rfl:    Review of Systems  Constitutional: Negative for activity change, appetite change, chills, diaphoresis, fatigue, fever and unexpected weight change.  HENT: Negative for congestion, rhinorrhea, sinus pressure, sneezing, sore throat  and trouble swallowing.   Eyes: Negative for photophobia and visual disturbance.  Respiratory: Negative for cough, chest tightness, shortness of breath, wheezing and stridor.   Cardiovascular: Negative for chest pain, palpitations and leg swelling.  Gastrointestinal: Negative for abdominal distention, abdominal pain, anal bleeding, blood in stool, constipation, diarrhea, nausea and vomiting.  Genitourinary: Negative for difficulty urinating, dysuria, flank pain and hematuria.  Musculoskeletal: Negative for arthralgias, back pain, gait problem, joint swelling and myalgias.  Skin: Negative for color change, pallor, rash and wound.  Neurological: Negative for dizziness, tremors, weakness and light-headedness.  Hematological: Negative for adenopathy. Does not bruise/bleed easily.  Psychiatric/Behavioral: Negative for agitation, behavioral problems, confusion, decreased concentration, dysphoric mood, self-injury, sleep disturbance and suicidal ideas. The patient is nervous/anxious.        Objective:   Physical Exam  Constitutional: He is oriented to person, place, and time. He appears well-developed and well-nourished.  HENT:  Head: Normocephalic and atraumatic.  Eyes: Conjunctivae and EOM are normal.  Neck: Normal range of motion. Neck supple.  Cardiovascular: Normal rate and regular rhythm.   Pulmonary/Chest: Effort normal. No respiratory distress. He has no wheezes.  Abdominal: Soft. He exhibits no distension.  Musculoskeletal: Normal range of motion. He exhibits no edema or tenderness.  Neurological: He is alert and oriented to person, place, and time.  Skin: Skin is warm and dry. No erythema. No pallor.  Psychiatric: His speech is normal and behavior is normal. Judgment and thought content normal. His mood appears not anxious. Cognition and memory are normal. He does not exhibit a depressed mood.   Folliculitis of left thigh       Assessment & Plan:   HIV : continue Intelence and  Descovy. Doing [place] to bridge   Depression: says he is doing much better and on Paxil  Polysubstance abuse: met with Jermaine Peterson again today  I spent greater than 40 minutes with the patient including greater than 50% of time in face to face counsel of the patient re his HIV, nature of HIV his ARV regimenshis depression and PSA and in coordination of his care.

## 2016-08-29 LAB — T-HELPER CELL (CD4) - (RCID CLINIC ONLY)
CD4 T CELL ABS: 480 /uL (ref 400–2700)
CD4 T CELL HELPER: 30 % — AB (ref 33–55)

## 2016-08-30 LAB — HIV-1 RNA ULTRAQUANT REFLEX TO GENTYP+: HIV-1 RNA Quant, Log: <1.3 Log copies/mL (ref <1.30)

## 2016-09-02 ENCOUNTER — Other Ambulatory Visit: Payer: Self-pay | Admitting: *Deleted

## 2016-09-02 DIAGNOSIS — B20 Human immunodeficiency virus [HIV] disease: Secondary | ICD-10-CM

## 2016-09-02 MED ORDER — ETRAVIRINE 200 MG PO TABS: 400.0000 mg | ORAL_TABLET | Freq: Every day | ORAL | 5 refills | Status: DC

## 2016-09-02 NOTE — Telephone Encounter (Signed)
[facility] application

## 2016-11-07 ENCOUNTER — Telehealth: Payer: Self-pay | Admitting: Pharmacist

## 2016-11-07 NOTE — Telephone Encounter (Signed)
Really happy about his sobriety and him feeling better. Yes I doubt getting a tattoo would be of any issue at all.  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 and use proper precautions

## 2016-11-07 NOTE — Telephone Encounter (Signed)
Jermaine Peterson called with a few questions. He asked if he could still get a tattoo even though he is HIV +. I told him that was completely fine and not to worry about it.  He also said he has been more sweaty lately, especially at night.  I explained to him that is a side effect of Paxil. He said it wasn't bothering him too much and he just wanted to make sure it was ok. Also said he is feeling great and has been sober for 3 months now - no alcohol, no cigarettes, no other drugs of any form. I congratulated him and reminded him of his upcoming appointments.

## 2016-11-08 NOTE — Telephone Encounter (Signed)
Agree 

## 2016-11-18 ENCOUNTER — Other Ambulatory Visit (INDEPENDENT_AMBULATORY_CARE_PROVIDER_SITE_OTHER): Payer: Self-pay

## 2016-11-18 ENCOUNTER — Ambulatory Visit: Payer: Self-pay

## 2016-11-18 DIAGNOSIS — Z113 Encounter for screening for infections with a predominantly sexual mode of transmission: Secondary | ICD-10-CM

## 2016-11-18 DIAGNOSIS — B20 Human immunodeficiency virus [HIV] disease: Secondary | ICD-10-CM

## 2016-11-18 LAB — CBC WITH DIFFERENTIAL/PLATELET
BASOS PCT: 0 %
Basophils Absolute: 0 cells/uL (ref 0–200)
EOS ABS: 0 {cells}/uL — AB (ref 15–500)
Eosinophils Relative: 0 %
HEMATOCRIT: 46.5 % (ref 38.5–50.0)
HEMOGLOBIN: 16.1 g/dL (ref 13.2–17.1)
LYMPHS ABS: 1848 {cells}/uL (ref 850–3900)
Lymphocytes Relative: 33 %
MCH: 31.3 pg (ref 27.0–33.0)
MCHC: 34.6 g/dL (ref 32.0–36.0)
MCV: 90.5 fL (ref 80.0–100.0)
MONO ABS: 560 {cells}/uL (ref 200–950)
MPV: 9.2 fL (ref 7.5–12.5)
Monocytes Relative: 10 %
Neutro Abs: 3192 cells/uL (ref 1500–7800)
Neutrophils Relative %: 57 %
Platelets: 269 10*3/uL (ref 140–400)
RBC: 5.14 MIL/uL (ref 4.20–5.80)
RDW: 12.6 % (ref 11.0–15.0)
WBC: 5.6 10*3/uL (ref 3.8–10.8)

## 2016-11-18 LAB — COMPLETE METABOLIC PANEL WITH GFR
ALT: 25 U/L (ref 9–46)
AST: 22 U/L (ref 10–40)
Albumin: 4.6 g/dL (ref 3.6–5.1)
Alkaline Phosphatase: 53 U/L (ref 40–115)
BUN: 13 mg/dL (ref 7–25)
CHLORIDE: 102 mmol/L (ref 98–110)
CO2: 29 mmol/L (ref 20–31)
CREATININE: 1.05 mg/dL (ref 0.60–1.35)
Calcium: 9.7 mg/dL (ref 8.6–10.3)
GFR, Est African American: >89 mL/min (ref >=60)
GFR, Est Non African American: >89 mL/min (ref >=60)
Glucose, Bld: 97 mg/dL (ref 65–99)
Potassium: 4.1 mmol/L (ref 3.5–5.3)
SODIUM: 139 mmol/L (ref 135–146)
Total Bilirubin: 0.4 mg/dL (ref 0.2–1.2)
Total Protein: 7.1 g/dL (ref 6.1–8.1)

## 2016-11-19 LAB — RPR

## 2016-11-19 LAB — T-HELPER CELL (CD4) - (RCID CLINIC ONLY)
CD4 T CELL HELPER: 32 % — AB (ref 33–55)
CD4 T Cell Abs: 700 /uL (ref 400–2700)

## 2016-11-19 LAB — URINE CYTOLOGY ANCILLARY ONLY
Chlamydia: NEGATIVE
Neisseria Gonorrhea: NEGATIVE

## 2016-11-22 LAB — HIV RNA, RTPCR W/R GT (RTI, PI,INT): HIV-1 RNA, QN PCR: <20 copies/mL

## 2016-12-02 ENCOUNTER — Ambulatory Visit: Payer: Self-pay | Admitting: Infectious Disease

## 2017-02-20 ENCOUNTER — Ambulatory Visit: Payer: Self-pay | Admitting: Infectious Disease

## 2017-02-25 ENCOUNTER — Ambulatory Visit: Payer: Self-pay | Admitting: Infectious Disease

## 2017-02-27 ENCOUNTER — Other Ambulatory Visit: Payer: Self-pay

## 2017-03-06 ENCOUNTER — Other Ambulatory Visit: Payer: Self-pay | Admitting: Pharmacist

## 2017-03-06 ENCOUNTER — Other Ambulatory Visit: Payer: Self-pay

## 2017-03-06 ENCOUNTER — Ambulatory Visit: Payer: Self-pay | Admitting: Pharmacist

## 2017-03-06 DIAGNOSIS — B20 Human immunodeficiency virus [HIV] disease: Secondary | ICD-10-CM

## 2017-03-06 DIAGNOSIS — F321 Major depressive disorder, single episode, moderate: Secondary | ICD-10-CM

## 2017-03-06 LAB — CBC WITH DIFFERENTIAL/PLATELET
Basophils Absolute: 42 cells/uL (ref 0–200)
Basophils Relative: 1 %
EOS PCT: 3 %
Eosinophils Absolute: 126 cells/uL (ref 15–500)
HCT: 44.1 % (ref 38.5–50.0)
HEMOGLOBIN: 15.4 g/dL (ref 13.2–17.1)
LYMPHS ABS: 1512 {cells}/uL (ref 850–3900)
LYMPHS PCT: 36 %
MCH: 31.6 pg (ref 27.0–33.0)
MCHC: 34.9 g/dL (ref 32.0–36.0)
MCV: 90.4 fL (ref 80.0–100.0)
MPV: 8.9 fL (ref 7.5–12.5)
Monocytes Absolute: 420 cells/uL (ref 200–950)
Monocytes Relative: 10 %
NEUTROS PCT: 50 %
Neutro Abs: 2100 cells/uL (ref 1500–7800)
Platelets: 250 10*3/uL (ref 140–400)
RBC: 4.88 MIL/uL (ref 4.20–5.80)
RDW: 12.9 % (ref 11.0–15.0)
WBC: 4.2 10*3/uL (ref 3.8–10.8)

## 2017-03-06 LAB — COMPLETE METABOLIC PANEL WITH GFR
ALBUMIN: 4.4 g/dL (ref 3.6–5.1)
ALK PHOS: 56 U/L (ref 40–115)
ALT: 91 U/L — ABNORMAL HIGH (ref 9–46)
AST: 54 U/L — AB (ref 10–40)
BUN: 16 mg/dL (ref 7–25)
CALCIUM: 9.3 mg/dL (ref 8.6–10.3)
CO2: 26 mmol/L (ref 20–31)
Chloride: 106 mmol/L (ref 98–110)
Creat: 0.94 mg/dL (ref 0.60–1.35)
GFR, Est Non African American: >89 mL/min (ref >=60)
Glucose, Bld: 89 mg/dL (ref 65–99)
POTASSIUM: 4.4 mmol/L (ref 3.5–5.3)
Sodium: 140 mmol/L (ref 135–146)
Total Bilirubin: 0.5 mg/dL (ref 0.2–1.2)
Total Protein: 6.6 g/dL (ref 6.1–8.1)

## 2017-03-06 MED ORDER — PAROXETINE HCL 30 MG PO TABS: 30.0000 mg | ORAL_TABLET | Freq: Every day | ORAL | 8 refills | Status: DC

## 2017-03-06 NOTE — Progress Notes (Unsigned)
5 doses since January
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
## 2017-03-07 LAB — T-HELPER CELL (CD4) - (RCID CLINIC ONLY)
CD4 % Helper T Cell: 32 % — ABNORMAL LOW (ref 33–55)
CD4 T Cell Abs: 510 /uL (ref 400–2700)

## 2017-03-07 LAB — URINE CYTOLOGY ANCILLARY ONLY
Chlamydia: NEGATIVE
Neisseria Gonorrhea: NEGATIVE

## 2017-03-07 LAB — RPR

## 2017-03-08 LAB — HIV-1 RNA,QN PCR W/REFLEX GENOTYPE: HIV-1 RNA, QN PCR: <1.3 Log cps/mL

## 2017-04-16 ENCOUNTER — Other Ambulatory Visit: Payer: Self-pay | Admitting: Pharmacist Clinician (PhC)/ Clinical Pharmacy Specialist

## 2017-04-16 ENCOUNTER — Ambulatory Visit (INDEPENDENT_AMBULATORY_CARE_PROVIDER_SITE_OTHER): Payer: Self-pay | Admitting: Infectious Disease

## 2017-04-16 ENCOUNTER — Encounter: Payer: Self-pay | Admitting: Infectious Disease

## 2017-04-16 VITALS — BP 144/81 | HR 74 | Temp 97.7°F | Ht 75.0 in | Wt 236.0 lb

## 2017-04-16 DIAGNOSIS — F32A Depression, unspecified: Secondary | ICD-10-CM

## 2017-04-16 DIAGNOSIS — F329 Major depressive disorder, single episode, unspecified: Secondary | ICD-10-CM

## 2017-04-16 DIAGNOSIS — B20 Human immunodeficiency virus [HIV] disease: Secondary | ICD-10-CM

## 2017-04-16 DIAGNOSIS — R635 Abnormal weight gain: Secondary | ICD-10-CM

## 2017-04-16 DIAGNOSIS — F1910 Other psychoactive substance abuse, uncomplicated: Secondary | ICD-10-CM

## 2017-04-16 DIAGNOSIS — Z811 Family history of alcohol abuse and dependence: Secondary | ICD-10-CM

## 2017-04-16 DIAGNOSIS — R7989 Other specified abnormal findings of blood chemistry: Secondary | ICD-10-CM

## 2017-04-16 DIAGNOSIS — F321 Major depressive disorder, single episode, moderate: Secondary | ICD-10-CM

## 2017-04-16 MED ORDER — ETRAVIRINE 200 MG PO TABS: 400.0000 mg | ORAL_TABLET | Freq: Every day | ORAL | 11 refills | Status: AC

## 2017-04-16 MED ORDER — PAROXETINE HCL 30 MG PO TABS: 30.0000 mg | ORAL_TABLET | Freq: Every day | ORAL | 8 refills | Status: AC

## 2017-04-16 MED ORDER — EMTRICITABINE-TENOFOVIR AF 200-25 MG PO TABS: 1.0000 | ORAL_TABLET | Freq: Every day | ORAL | 11 refills | Status: AC

## 2017-04-16 MED FILL — INTELENCE 200 MG TABLET: 200 | 30 days supply | Qty: 60 | Fill #0

## 2017-04-16 MED FILL — PARoxetine HCL 30 MG TABS: 30 | 30 days supply | Qty: 30 | Fill #0

## 2017-04-16 MED FILL — DESCOVY 200-25 MG TABS: 200-25 | 30 days supply | Qty: 30 | Fill #0

## 2017-04-16 NOTE — Progress Notes (Signed)
Chief complaints: followup for HIV, also complaining of weight gain  Subjective:    Patient ID: Jermaine Peterson, male    DOB: 1991-08-24, 26 y.o.   MRN: [id number]  HPI  12 ear old Caucasian man HIV diagnosed roughly a year ago shortly after what sounds like an acute infection. W evaluated for possible acute infection with ACTG and whom I had rx Tivicay and Descovy via [employer] until he could get [employer] and ADAP filled out.  His virus at baseline was low and his genotype wildtype. He is Hep B negative and HLA b701 negative.   Since starting his Tivicay and Descovy he has slept poorly despite taking the meds in the am.  He has had worsening of this depression though some of this is related to his diagnosis of HIV itself.   He has developed folliculitis on his left leg where he had been picking at are that was inflamed and itching  He has also been complaining of sharp pain in his right side at times that he wondered  could be due to the medications.  He had attained a undetectable viral load on this regimen but we switched him over to [facility] which he has tolerated well. However he disliked having to remember to take the medicine with food. We then switched him back to [place] but he tolerated it poorly again and we went to Intelence 442m daily with Descovy.  Since I last I'm he has seen Cassie with [facility] several times. His Paxil has been increased. He recently began to make too much money to qualify for the ADAP program. He is now pEngineer, materials We want to make sure that the insurance would cover his antiretroviral medications. Otherwise he would be without them for a few weeks.  He had may questions about his weight gain and abdominal girth that is increased. His past medical history had indicated that he had low testosterone in the past and I would like to test for this and see if testosterone repletion might help him. Otherwise I recommended low carbohydrate  diet and exercise as the best most durable means of addressing this human growth hormone could be used for visceral adiposity as well but we would need to optimize the rest of his care first and human growth hormone is not typically a very optimal long-term option in my opinion.   Past Medical History:  Diagnosis Date  . Depression 05/15/2016  . Folliculitis 78/31/5176 . Hypogonadism in male 05/15/2016  . Insomnia 05/15/2016  . Polysubstance abuse 08/28/2016  . Tattoos 05/15/2016  . Weight gain 07/03/2016    Past Surgical History:  Procedure Laterality Date  . HERNIA REPAIR Left    inguinal    Family History  Problem Relation Age of Onset  . Cancer Mother   . Hypertension Mother   . Cancer Father   . Hypertension Father       Social History   Social History  . Marital status: Single    Spouse name: N/A  . Number of children: N/A  . Years of education: N/A   Social History Main Topics  . Smoking status: Former Smoker    Packs/day: 0.15    Types: Cigarettes    Quit date: 08/04/2016  . Smokeless tobacco: Never Used  . Alcohol use No     Comment: stopped 08/04/2016  . Drug use: Yes    Frequency: 7.0 times per week    Types: Marijuana  .  Sexual activity: Not Currently    Partners: Male   Other Topics Concern  . None   Social History Narrative  . None    Allergies  Allergen Reactions  . Nickel Other (See Comments)    Burns skin      Current Outpatient Prescriptions:  .  acetaminophen (TYLENOL) 325 MG tablet, Take 650 mg by mouth every 6 (six) hours as needed for moderate pain or fever. Reported on 05/15/2016, Disp: , Rfl:  .  emtricitabine-tenofovir AF (DESCOVY) 200-25 MG tablet, Take 1 tablet by mouth daily., Disp: 30 tablet, Rfl: 11 .  Etravirine (INTELENCE) 200 MG TABS, Take 2 tablets (400 mg total) by mouth daily., Disp: 60 tablet, Rfl: 5 .  PARoxetine (PAXIL) 30 MG tablet, Take 1 tablet (30 mg total) by mouth daily., Disp: 30 tablet, Rfl: 8 .  doxycycline  (VIBRA-TABS) 100 MG tablet, Take 1 tablet (100 mg total) by mouth 2 (two) times daily. (Patient not taking: Reported on 08/28/2016), Disp: 20 tablet, Rfl: 1 .  OVER THE COUNTER MEDICATION, Take 2 tablets by mouth daily. Reported on 05/15/2016, Disp: , Rfl:  .  testosterone (ANDROGEL) 50 MG/5GM (1%) GEL, Place 5 g onto the skin daily. Reported on 05/15/2016, Disp: , Rfl:    Review of Systems  Constitutional: Positive for unexpected weight change. Negative for activity change, appetite change, chills, diaphoresis, fatigue and fever.  HENT: Negative for congestion, rhinorrhea, sinus pressure, sneezing, sore throat and trouble swallowing.   Eyes: Negative for photophobia and visual disturbance.  Respiratory: Negative for cough, chest tightness, shortness of breath, wheezing and stridor.   Cardiovascular: Negative for chest pain, palpitations and leg swelling.  Gastrointestinal: Negative for abdominal distention, abdominal pain, anal bleeding, blood in stool, constipation, diarrhea, nausea and vomiting.  Genitourinary: Positive for flank pain. Negative for difficulty urinating, dysuria and hematuria.  Musculoskeletal: Negative for arthralgias, back pain, gait problem, joint swelling and myalgias.  Skin: Negative for color change, pallor, rash and wound.  Neurological: Negative for dizziness, tremors, weakness and light-headedness.  Hematological: Negative for adenopathy. Does not bruise/bleed easily.  Psychiatric/Behavioral: Negative for agitation, behavioral problems, confusion, decreased concentration, dysphoric mood, self-injury, sleep disturbance and suicidal ideas. The patient is nervous/anxious.        Objective:   Physical Exam  Constitutional: He is oriented to person, place, and time. He appears well-developed and well-nourished.  HENT:  Head: Normocephalic and atraumatic.  Eyes: Conjunctivae and EOM are normal.  Neck: Normal range of motion. Neck supple.  Cardiovascular: Normal rate  and regular rhythm.   Pulmonary/Chest: Effort normal. No respiratory distress. He has no wheezes.  Abdominal: Soft. He exhibits no distension.  Musculoskeletal: Normal range of motion. He exhibits no edema or tenderness.  Neurological: He is alert and oriented to person, place, and time.  Skin: Skin is warm and dry. No erythema. No pallor.  Psychiatric: His speech is normal and behavior is normal. Judgment and thought content normal. His mood appears not anxious. Cognition and memory are normal. He does not exhibit a depressed mood.         Assessment & Plan:   HIV : continue Intelence and Descovy. We're filling the medications it was a long hospital   Depression: says he is doing much better and on Paxil  Polysubstance abuse: met with Leveda Anna again today: Claims she is clean he had elevation slightly in his transaminases but this could be due to exercise.  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  Weight gain as habits frequent with control of HIV infection I recommended that we initiate a low carbohydrate diet I also recommended we recheck his testosterone and thyroid-stimulating hormone with an early morning lab in the future.  I spent greater than 40 minutes with the patient including greater than 50% of time in face to face counsel of the patient re his HIV, nature of HIV his ARV regimenshis depression, his weight gain his insurance coverage and in coordination of his care.

## 2017-04-16 NOTE — Progress Notes (Signed)
HPI: Jermaine Peterson is a 26 y.o. male who presents to the [facility] clinic today to follow-up with Dr. Daiva EvesVan Dam.  Allergies: Allergies  Allergen Reactions  . Nickel Other (See Comments)    Burns skin     Past Medical History: Past Medical History:  Diagnosis Date  . Depression 05/15/2016  . Folliculitis 05/15/2016  . Hypogonadism in male 05/15/2016  . Insomnia 05/15/2016  . Polysubstance abuse 08/28/2016  . Tattoos 05/15/2016  . Weight gain 07/03/2016    Social History: Social History   Social History  . Marital status: Single    Spouse name: N/A  . Number of children: N/A  . Years of education: N/A   Social History Main Topics  . Smoking status: Former Smoker    Packs/day: 0.15    Types: Cigarettes    Quit date: 08/04/2016  . Smokeless tobacco: Never Used  . Alcohol use No     Comment: stopped 08/04/2016  . Drug use: Yes    Frequency: 7.0 times per week    Types: Marijuana  . Sexual activity: Not Currently    Partners: Male   Other Topics Concern  . None   Social History Narrative  . None    Current Regimen: Intelence + Descovy  Labs: HIV 1 RNA Quant (copies/mL)  Date Value  08/28/2016 <20  07/03/2016 <20  05/15/2016 <20   CD4 T Cell Abs (/uL)  Date Value  03/06/2017 510  11/18/2016 700  08/28/2016 480   Hep B S Ab (no units)  Date Value  04/23/2016 POS (A)   Hepatitis B Surface Ag (no units)  Date Value  04/23/2016 NEGATIVE   HCV Ab (no units)  Date Value  04/23/2016 NEGATIVE    CrCl: CrCl cannot be calculated (Patient's most recent lab result is older than the maximum 21 days allowed.).  Lipids:    Component Value Date/Time   CHOL 139 04/23/2016 1025   TRIG 109 04/23/2016 1025   HDL 42 04/23/2016 1025   CHOLHDL 3.3 04/23/2016 1025   VLDL 22 04/23/2016 1025   LDLCALC 75 04/23/2016 1025    Assessment: Jermaine Peterson is here today to follow-up with Dr. Daiva EvesVan Dam. He is doing well on Intelence and Descovy and has finally bought insurance which is  now active.  [name] called and got the information from [employer] insurance company. [facility] and we will activate copay cards for his $3,000 copay.  He is also doing much better with his depression he tells me.  He will follow-up with me in 1 month.   Plans: - Continue Intelence + Descovy - [facility] - F/u with me again 7/18 at 9:30am  [name], PharmD, CPP Infectious Diseases Clinical Pharmacist [facility] 04/16/2017, 2:12 PM

## 2017-04-16 NOTE — Progress Notes (Signed)
[facility]

## 2017-05-12 MED FILL — INTELENCE 200 MG TABLET: 200 | 30 days supply | Qty: 60 | Fill #1

## 2017-05-12 MED FILL — PARoxetine HCL 30 MG TABS: 30 | 30 days supply | Qty: 30 | Fill #1

## 2017-05-12 MED FILL — DESCOVY 200-25 MG TABS: 200-25 | 30 days supply | Qty: 30 | Fill #1

## 2017-05-14 ENCOUNTER — Ambulatory Visit: Payer: Self-pay

## 2017-05-21 ENCOUNTER — Ambulatory Visit: Payer: Self-pay

## 2017-05-21 ENCOUNTER — Other Ambulatory Visit: Payer: Self-pay

## 2017-05-31 ENCOUNTER — Encounter (HOSPITAL_COMMUNITY): Payer: Self-pay | Admitting: Family Medicine

## 2017-05-31 ENCOUNTER — Ambulatory Visit (HOSPITAL_COMMUNITY)
Admission: EM | Admit: 2017-05-31 | Discharge: 2017-05-31 | Disposition: A | Discharge status: Home / Self Care | Payer: Self-pay | Attending: Family Medicine | Admitting: Family Medicine

## 2017-05-31 DIAGNOSIS — L0231 Cutaneous abscess of buttock: Secondary | ICD-10-CM

## 2017-05-31 MED ORDER — SULFAMETHOXAZOLE-TRIMETHOPRIM 800-160 MG PO TABS: 1.0000 | ORAL_TABLET | Freq: Two times a day (BID) | ORAL | 0 refills | Status: AC

## 2017-05-31 MED ORDER — HYDROCODONE-ACETAMINOPHEN 5-325 MG PO TABS: 1.0000 | ORAL_TABLET | Freq: Four times a day (QID) | ORAL | 0 refills | Status: AC | PRN

## 2017-05-31 NOTE — ED Triage Notes (Signed)
Pt presents today with abscess on buttocks that he states has been there since the beginning of the week. States that his gotten progressively worse since he noticed it. Very painful to sit down and has gotten bigger in size.

## 2017-06-02 ENCOUNTER — Encounter (HOSPITAL_COMMUNITY): Payer: Self-pay | Admitting: *Deleted

## 2017-06-02 ENCOUNTER — Ambulatory Visit (HOSPITAL_COMMUNITY)
Admission: EM | Admit: 2017-06-02 | Discharge: 2017-06-02 | Disposition: A | Discharge status: Home / Self Care | Payer: Self-pay

## 2017-06-02 DIAGNOSIS — Z09 Encounter for follow-up examination after completed treatment for conditions other than malignant neoplasm: Secondary | ICD-10-CM

## 2017-06-02 DIAGNOSIS — L0231 Cutaneous abscess of buttock: Secondary | ICD-10-CM

## 2017-06-02 NOTE — ED Provider Notes (Signed)
CSN: [id number]     Arrival date & time 06/02/17  1625 History   First MD Initiated Contact with Patient 06/02/17 1753     Chief Complaint  Patient presents with  . Follow-up   (Consider location/radiation/quality/duration/timing/severity/associated sxs/prior Treatment) Pt was seen on the 19th for I&D of RT buttocks area. He is here for follow up care. Pt states the packing fell out today while taking a shower. Denies any fevers, no n/v/d. Pain is minimal taking pain meds as needed. States that area has had a large amount of drainage but has slowed down today.       Past Medical History:  Diagnosis Date  . Depression 05/15/2016  . Folliculitis 05/15/2016  . Hypogonadism in male 05/15/2016  . Insomnia 05/15/2016  . Polysubstance abuse 08/28/2016  . Tattoos 05/15/2016  . Weight gain 07/03/2016   Past Surgical History:  Procedure Laterality Date  . HERNIA REPAIR Left    inguinal   Family History  Problem Relation Age of Onset  . Cancer Mother   . Hypertension Mother   . Cancer Father   . Hypertension Father    Social History  Substance Use Topics  . Smoking status: Former Smoker    Packs/day: 0.15    Types: Cigarettes    Quit date: 08/04/2016  . Smokeless tobacco: Never Used  . Alcohol use No     Comment: stopped 08/04/2016    Review of Systems  Constitutional: Negative.   Respiratory: Negative.   Cardiovascular: Negative.   Skin:       Open wound to RT buttocks area from and I&D   Neurological: Negative.     Allergies  Nickel  Home Medications   Prior to Admission medications   Medication Sig Start Date End Date Taking? Authorizing Provider  acetaminophen (TYLENOL) 325 MG tablet Take 650 mg by mouth every 6 (six) hours as needed for moderate pain or fever. Reported on 05/15/2016    [provider]  doxycycline (VIBRA-TABS) 100 MG tablet Take 1 tablet (100 mg total) by mouth 2 (two) times daily. Patient not taking: Reported on 08/28/2016 05/15/16   Daiva EvesVan  Dam, Lisette Grinderornelius N, MD  emtricitabine-tenofovir AF (DESCOVY) 200-25 MG tablet Take 1 tablet by mouth daily. 04/16/17   Randall HissVan Dam, Cornelius N, MD  Etravirine (INTELENCE) 200 MG TABS Take 2 tablets (400 mg total) by mouth daily. 04/16/17   Randall HissVan Dam, Cornelius N, MD  HYDROcodone-acetaminophen (NORCO/VICODIN) 5-325 MG tablet Take 1 tablet by mouth every 6 (six) hours as needed for moderate pain or severe pain. 05/31/17   Mardella LaymanHagler, Brian, MD  OVER THE COUNTER MEDICATION Take 2 tablets by mouth daily. Reported on 05/15/2016    [provider]  PARoxetine (PAXIL) 30 MG tablet Take 1 tablet (30 mg total) by mouth daily. 04/16/17   Randall HissVan Dam, Cornelius N, MD  sulfamethoxazole-trimethoprim (BACTRIM DS,SEPTRA DS) 800-160 MG tablet Take 1 tablet by mouth 2 (two) times daily. 05/31/17 06/07/17  Mardella LaymanHagler, Brian, MD  testosterone (ANDROGEL) 50 MG/5GM (1%) GEL Place 5 g onto the skin daily. Reported on 05/15/2016    [provider]   Meds Ordered and Administered this Visit  Medications - No data to display  BP 124/61 (BP Location: Left Arm)   Pulse 80   Temp 97.8 F (36.6 C) (Oral)   Resp 16   SpO2 96%  No data found.   Physical Exam  Constitutional: He appears well-developed.  Cardiovascular: Normal rate and regular rhythm.   Pulmonary/Chest: Effort normal  and breath sounds normal.  Abdominal: Soft. Bowel sounds are normal.  Neurological: He is alert.  Skin: Skin is warm.  approx 2cm opening to rt inner gluteal area , serosanguinous drainage minimal, packing is out,     Urgent Care Course     Procedures (including critical care time)  Labs Review Labs Reviewed - No data to display  Imaging Review No results found.           MDM   1. Follow up    Wash and dry the area  Take full dose of abx  Take pain meds as needed Continue to cover area  If the area gets worse you will need to be re seen Reviewed previous chart     Tobi BastosMitchell, Rue Tinnel A, NP 06/02/17 1806

## 2017-06-02 NOTE — ED Provider Notes (Signed)
  [facility]   [id number] 05/31/17 Arrival Time: 1447  ASSESSMENT & PLAN:  1. Abscess of buttock, right     Meds ordered this encounter  Medications  . sulfamethoxazole-trimethoprim (BACTRIM DS,SEPTRA DS) 800-160 MG tablet    Sig: Take 1 tablet by mouth 2 (two) times daily.    Dispense:  14 tablet    Refill:  0  . HYDROcodone-acetaminophen (NORCO/VICODIN) 5-325 MG tablet    Sig: Take 1 tablet by mouth every 6 (six) hours as needed for moderate pain or severe pain.    Dispense:  12 tablet    Refill:  0   Procedure: Verbal consent were obtained. The indication for the procedure was clinical suspicion for an abscess. Local anesthesia with 2% lidocaine without epinephrine. The most fluctuant portion of the abscess was incised with a #11 blade scalpel. The abscess cavity of explored and evacuated, all loculations were broken up with a curved hemostat as much as possible given his amount of discomfort. The cavity was then packed with packing material and dressed with a clean gauze dressing. Instructed to f/u in 48 hours for packing removal and wound check.  Medication sedation precautions given. Start and finish antibiotic.  Reviewed expectations re: course of current medical issues. Questions answered. Outlined signs and symptoms indicating need for more acute intervention. Patient verbalized understanding. After Visit Summary given.   SUBJECTIVE:  Jermaine Peterson is a 26 y.o. male who presents with complaint of possible abscess of R buttock. Present for a few days. Started as small bump. Progressively worsening. Now very painful. Much pain when sitting. Afebrile. No drainage from area. No h/o similar. OTC analgesics without relief.  ROS: As per HPI.   OBJECTIVE:  Vitals:   05/31/17 1505  BP: (!) 145/79  Pulse: 96  Temp: 97.7 F (36.5 C)  SpO2: 98%     General appearance: alert; appears to be in pain Skin: inner R buttock with 3-4 cm area of skin thickening and  induration; mild fluctuance at center; no active drainage   Allergies  Allergen Reactions  . Nickel Other (See Comments)    Burns skin     PMHx, SurgHx, SocialHx, Medications, and Allergies were reviewed in the Visit Navigator and updated as appropriate.      Mardella LaymanHagler, Eshani Maestre, MD 06/04/17 [phone]

## 2017-06-02 NOTE — ED Triage Notes (Signed)
Pt  Had  I  And  D   2  Days  Ago  Is  Here   For  followup     Still  Has       Pain  But  Doing better   Packing  Came  Out  Today  While    Changing  dressing

## 2017-06-02 NOTE — Discharge Instructions (Signed)
Wash and dry the area  Take full dose of abx  Take pain meds as needed Continue to cover area  If the area gets worse you will need to be re seen

## 2017-06-11 MED FILL — PARoxetine HCL 30 MG TABS: 30 | 30 days supply | Qty: 30 | Fill #2

## 2017-06-11 MED FILL — INTELENCE 200 MG TABLET: 200 | 30 days supply | Qty: 60 | Fill #2

## 2017-06-11 MED FILL — DESCOVY 200-25 MG TABS: 200-25 | 30 days supply | Qty: 30 | Fill #2

## 2017-07-02 ENCOUNTER — Telehealth: Payer: Self-pay | Admitting: Pharmacy Technician

## 2017-07-02 NOTE — Telephone Encounter (Signed)
Mr. Jermaine Peterson called with concerns about having suicidal thoughts.  He stated that his brother has bipolar disorder and that he feels he may also.  He was prescribed an antidepressant for depression and read that it may make bipolar symptoms worse and could I refer him to someone who can help him with his medications and possibly a mood stabilizer.    I consulted with Jermaine Peterson, our clinic counselor, and she gave me resources for Mr. Jermaine Peterson.  When I called Mr. Jermaine Peterson back, I included Jermaine Peterson in the call so that she could add any information to what I was providing.  I told him the following then texted it to him.  He thanked me and said that [facility].  The text included (sent to [phone]): "Clyde LundborgHey [name] Here is the information we talked about...  [facility] [address] [place] Open 24 hours a day and take walk ins [phone]  Also there are two 24 hour a day mobile crisis options:  [facility] [phone]   And  [facility] [phone]  Jeannette HowBetty Barrie Sigmund"  He responded back with a message "[name]"  I messaged back "[name]"

## 2017-07-10 ENCOUNTER — Telehealth: Payer: Self-pay | Admitting: Pharmacy Technician

## 2017-07-10 ENCOUNTER — Other Ambulatory Visit: Payer: Self-pay | Admitting: Pharmacy Technician

## 2017-07-10 MED FILL — DESCOVY 200-25 MG TABS: 200-25 | 30 days supply | Qty: 30 | Fill #3

## 2017-07-10 MED FILL — INTELENCE 200 MG TABLET: 200 | 30 days supply | Qty: 60 | Fill #3

## 2017-07-10 MED FILL — PARoxetine HCL 30 MG TABS: 30 | 30 days supply | Qty: 30 | Fill #3

## 2017-07-10 NOTE — Telephone Encounter (Signed)
Sent [name] this information...  The [employer] insurance you have is a pharmacy card that doesn't cover much of the cost each fill.  That is why the copay assistance I got for you is used up.     I found another organization that has awarded you a guaranteed $3500.  There is an additional $4000 if they have the funds at the time that you need them.    So for this fill...   Descovy  [place] pharmacy card paid  $128.09  current copayassistance paid          $1429.88  (taken out of the award granted above)   Intelence  [place] pharmacy card paid   $0.00  past copay card paid            $1232.55 (and this will be the last month for that card...we will use the above award for next month)   The award will run out quickly and I do not have any other options out there.  When you can, you need pharmacy insurance that covers more of the cost of the medications.   Contact me at anytime.  I'm here to help.   Sincerely,   Jeannette HowBetty Sherrel Ploch   [phone]

## 2017-08-04 MED FILL — DESCOVY 200-25 MG TABS: 200-25 | 30 days supply | Qty: 30 | Fill #4

## 2017-08-04 MED FILL — INTELENCE 200 MG TABLET: 200 | 30 days supply | Qty: 60 | Fill #4

## 2017-08-04 MED FILL — PARoxetine HCL 30 MG TABS: 30 | 30 days supply | Qty: 30 | Fill #4

## 2017-08-21 ENCOUNTER — Other Ambulatory Visit: Payer: Self-pay | Admitting: Pharmacist

## 2017-08-21 ENCOUNTER — Telehealth: Payer: Self-pay | Admitting: Pharmacist

## 2017-08-21 NOTE — Telephone Encounter (Signed)
Called Jermaine Peterson to follow-up on how he is doing.  He has backed off of his Paxil dose from 30 mg to 15 mg (1/2 tablet) and is feeling better.  He is afraid he has underlying bipolar disorder like his brother and is asking us to refer him to a psychiatrist or psychologist. [facility] and [facility] was a 2 hour wait.  He also called a resource we gave him and it was a 6 week wait to see someone.  He is not having many thoughts of suicide anymore but knows he needs to see someone soon regarding his mood and thoughts.  Dr. Daiva EvesVan Dam -- can we refer him to see someone for his?

## 2017-08-21 NOTE — Telephone Encounter (Signed)
I am not sure where to refer him. One of my patients [facility]. Selena BattenKim may know who he saw

## 2017-08-27 NOTE — Telephone Encounter (Signed)
Selena BattenKim suggested [facility]. Morrie Sheldonshley, can you help refer him to this center please? Thank you!

## 2017-08-27 NOTE — Telephone Encounter (Signed)
Thanks so much Cassie and [employer] Techn[employer]

## 2017-09-02 MED FILL — PARoxetine HCL 30 MG TABS: 30 | 30 days supply | Qty: 30 | Fill #5

## 2017-09-02 MED FILL — INTELENCE 200 MG TABLET: 200 | 30 days supply | Qty: 60 | Fill #5

## 2017-09-02 MED FILL — DESCOVY 200-25 MG TABS: 200-25 | 30 days supply | Qty: 30 | Fill #5

## 2017-09-22 ENCOUNTER — Other Ambulatory Visit: Payer: Self-pay | Admitting: Infectious Disease

## 2017-09-22 DIAGNOSIS — F331 Major depressive disorder, recurrent, moderate: Secondary | ICD-10-CM

## 2017-09-26 MED FILL — DESCOVY 200-25 MG TABS: 200-25 | 30 days supply | Qty: 30 | Fill #6

## 2017-09-26 MED FILL — PARoxetine HCL 30 MG TABS: 30 | 30 days supply | Qty: 30 | Fill #6

## 2017-09-26 MED FILL — INTELENCE 200 MG TABLET: 200 | 30 days supply | Qty: 60 | Fill #6

## 2017-10-22 ENCOUNTER — Other Ambulatory Visit: Payer: Self-pay | Admitting: Pharmacist

## 2017-10-22 DIAGNOSIS — B20 Human immunodeficiency virus [HIV] disease: Secondary | ICD-10-CM

## 2017-10-22 MED FILL — PARoxetine HCL 30 MG TABS: 30 | 30 days supply | Qty: 30 | Fill #7

## 2017-11-11 ENCOUNTER — Other Ambulatory Visit: Payer: Self-pay | Admitting: Pharmacist

## 2017-11-11 ENCOUNTER — Other Ambulatory Visit: Payer: Self-pay | Admitting: *Deleted

## 2017-11-11 DIAGNOSIS — F329 Major depressive disorder, single episode, unspecified: Secondary | ICD-10-CM

## 2017-11-11 DIAGNOSIS — F32A Depression, unspecified: Secondary | ICD-10-CM

## 2017-11-13 ENCOUNTER — Telehealth: Payer: Self-pay | Admitting: Pharmacy Technician

## 2017-11-13 ENCOUNTER — Telehealth: Payer: Self-pay | Admitting: Pharmacist

## 2017-11-13 NOTE — Telephone Encounter (Signed)
What a mess. Do we have samples of anything. Is he willing to take Atripla that we have had?

## 2017-11-13 NOTE — Telephone Encounter (Signed)
Jermaine Peterson has had insurance that only covers ~$35 per month of his HIV medications. He was made aware of this last summer. He makes too much money for [employer] and for [employer].  [name] has successfully gotten him assistance through multiple avenues the last 3-4 months but we have run out of options.  Jermaine Peterson promised he would get new insurance when we told him this back in October but tells us today that he did not get new insurance like promised and let the ball drop.   We are out of options for him and he has to get new insurance in order to get his medications.  He is aware of this.

## 2017-11-13 NOTE — Telephone Encounter (Signed)
Jermaine Peterson was made aware that his insurance pays practically nothing towards his HIV meds monthly.  He was given several breakouts of the copays and how they were using up copay assistance funds quickly.  I got as many copay awards as are available and now they are out. He messaged me and said he was sorry, he did not get new insurance.  He makes too much money to get [employer], that was attempted.

## 2018-02-26 ENCOUNTER — Telehealth: Payer: Self-pay | Admitting: *Deleted

## 2018-02-26 NOTE — Telephone Encounter (Signed)
Per request for records the patient has officially moved to [place] and is trying to get Case management. Documents for release placed in bin to be sent to [facility].

## 2018-10-05 ENCOUNTER — Telehealth: Payer: Self-pay | Admitting: *Deleted

## 2018-10-05 NOTE — Telephone Encounter (Signed)
Patient called, asked for proof of positivity to be faxed to clinic where he would like to transfer care in [place]. RN advised patient to sign a release and have the new office fax it to [facility].  Last office note, labs and proof of positivity printed for transmission once we receive the release. Andree CossHowell, Marshon Bangs M, RN

## 2019-05-28 ENCOUNTER — Telehealth: Payer: Self-pay | Admitting: *Deleted

## 2019-05-28 NOTE — Telephone Encounter (Signed)
Received signed request for information from [facility].  RN sent last office note, labs per request.  Patient has not been here since 2018. Landis Gandy, RN
